# Patient Record
Sex: Female | Born: 1940 | Race: White | Hispanic: No | Marital: Married | Smoking: Former smoker
Health system: Southern US, Community
[De-identification: ages and names within clinical notes are randomized; demographics above are authoritative.]

## PROBLEM LIST (undated history)

## (undated) DIAGNOSIS — K589 Irritable bowel syndrome without diarrhea: Secondary | ICD-10-CM

## (undated) DIAGNOSIS — E11319 Type 2 diabetes mellitus with unspecified diabetic retinopathy without macular edema: Secondary | ICD-10-CM

## (undated) DIAGNOSIS — E0590 Thyrotoxicosis, unspecified without thyrotoxic crisis or storm: Secondary | ICD-10-CM

## (undated) DIAGNOSIS — D649 Anemia, unspecified: Secondary | ICD-10-CM

## (undated) DIAGNOSIS — K2270 Barrett's esophagus without dysplasia: Secondary | ICD-10-CM

## (undated) DIAGNOSIS — E669 Obesity, unspecified: Secondary | ICD-10-CM

## (undated) DIAGNOSIS — K559 Vascular disorder of intestine, unspecified: Secondary | ICD-10-CM

## (undated) DIAGNOSIS — I10 Essential (primary) hypertension: Secondary | ICD-10-CM

## (undated) DIAGNOSIS — I350 Nonrheumatic aortic (valve) stenosis: Secondary | ICD-10-CM

## (undated) DIAGNOSIS — E119 Type 2 diabetes mellitus without complications: Secondary | ICD-10-CM

## (undated) HISTORY — PX: EYE SURGERY: SHX253

## (undated) HISTORY — PX: CHOLECYSTECTOMY: SHX55

## (undated) HISTORY — PX: TONSILLECTOMY: SUR1361

## (undated) HISTORY — PX: CARDIAC CATHETERIZATION: SHX172

---

## 2007-06-04 ENCOUNTER — Ambulatory Visit: Payer: Self-pay | Admitting: Unknown Physician Specialty

## 2007-06-24 ENCOUNTER — Ambulatory Visit: Payer: Self-pay | Admitting: Surgery

## 2007-06-29 ENCOUNTER — Ambulatory Visit: Payer: Self-pay | Admitting: Surgery

## 2007-09-09 ENCOUNTER — Ambulatory Visit: Payer: Self-pay | Admitting: Unknown Physician Specialty

## 2007-11-16 ENCOUNTER — Other Ambulatory Visit: Payer: Self-pay

## 2007-11-16 ENCOUNTER — Inpatient Hospital Stay: Payer: Self-pay | Admitting: Specialist

## 2007-11-26 ENCOUNTER — Ambulatory Visit: Payer: Self-pay | Admitting: Unknown Physician Specialty

## 2008-06-07 ENCOUNTER — Other Ambulatory Visit: Payer: Self-pay

## 2008-06-07 ENCOUNTER — Ambulatory Visit: Payer: Self-pay | Admitting: Ophthalmology

## 2008-06-20 ENCOUNTER — Ambulatory Visit: Payer: Self-pay | Admitting: Ophthalmology

## 2008-09-27 ENCOUNTER — Ambulatory Visit: Payer: Self-pay | Admitting: Unknown Physician Specialty

## 2008-12-02 ENCOUNTER — Ambulatory Visit: Payer: Self-pay | Admitting: Cardiology

## 2009-10-11 ENCOUNTER — Ambulatory Visit: Payer: Self-pay | Admitting: Unknown Physician Specialty

## 2010-02-02 ENCOUNTER — Emergency Department: Payer: Self-pay | Admitting: Emergency Medicine

## 2010-11-16 ENCOUNTER — Ambulatory Visit: Payer: Self-pay | Admitting: Unknown Physician Specialty

## 2011-09-17 ENCOUNTER — Inpatient Hospital Stay: Payer: Self-pay | Admitting: *Deleted

## 2012-01-17 ENCOUNTER — Ambulatory Visit: Payer: Self-pay | Admitting: Unknown Physician Specialty

## 2012-02-28 ENCOUNTER — Ambulatory Visit: Payer: Self-pay | Admitting: Unknown Physician Specialty

## 2012-03-03 LAB — PATHOLOGY REPORT

## 2012-06-11 ENCOUNTER — Ambulatory Visit: Payer: Self-pay | Admitting: Orthopedic Surgery

## 2013-01-27 ENCOUNTER — Ambulatory Visit: Payer: Self-pay | Admitting: Unknown Physician Specialty

## 2015-01-15 NOTE — H&P (Signed)
PATIENT NAME:  Gabrielle Evans, Gabrielle Evans MR#:  [id number] DATE OF BIRTH:  [birth year]  DATE OF ADMISSION:  09/17/2011  REFERRING PHYSICIAN: Dr. Mindi Junker   PRIMARY PHYSICIAN: Silver Huguenin, MD   PRESENTING COMPLAINT: Nausea, vomiting, sore throat, vocal hoarseness.   HISTORY OF PRESENT ILLNESS: Gabrielle Evans is a 74 year old woman with history of hypertension, diabetes, obesity, and history of prior GI bleed who presents today with complaints of worsening symptoms of nausea and vomiting. She reports that approximately six days ago she developed sore throat with progression to vocal hoarseness. She presented to the [facility] on Saturday with management for URI and supportive medication. She developed some nausea later that day with progression to nausea and vomiting the next day on Sunday. She returned to the [facility] and was given antiemetics but still continued to have nausea and vomiting with poor p.o. intake so she presented here for further evaluation. The patient denies any chest pain or shortness of breath. She has minimal cough. No bleeding or hemoptysis. She denies any orthopnea, PND, palpitations, or syncope. She has generalized weakness. No fever that she is aware of.   PAST MEDICAL HISTORY:  1. Hypertension.  2. Diabetes.  3. Obesity.  4. Irritable bowel syndrome.  5. Diabetic retinopathy.  6. History of GI bleed secondary to colitis in 2003. She had a colonoscopy that showed some evidence of colitis.   PAST SURGICAL HISTORY:  1. Bilateral cataract surgery.  2. Cholecystectomy.  3. Right eye surgery.  4. Tonsillectomy.   FAMILY HISTORY: Diabetes, prostate cancer, hypertension, pancreatic cancer, and coronary artery disease. Her mother died of MI at the age of 24.   SOCIAL HISTORY: She lives in [place] with her fiancee. She quit smoking in her 30's. Denies any alcohol or drug use.   ALLERGIES: No known drug allergies. She does report sensitivities to multiple blood pressure  medications.   MEDICATIONS:  1. Hydrochlorothiazide 25 mg daily.  2. Levemir 35 units at bedtime but the patient does adjust her insulin supplementation based on her blood sugar levels.  3. Metformin 1000 mg in the morning and 1500 mg in the evening.  4. Avapro 150 mg. Again, she has been self managing her dosing and has been administering one-third of the 150 which is approximately 50 mg daily.  5. Aspirin 81 mg daily.  6. KCl 10 mEq daily.   REVIEW OF SYSTEMS: CONSTITUTIONAL: No subjective fevers. She has generalized weakness, nausea and vomiting with anorexia. EYES: No glaucoma. She has history of cataracts. ENT: No ear pain or epistaxis. She reports some rhinorrhea and sore throat and vocal hoarseness but denies any dysphagia. RESPIRATORY: Has minimal cough, nonproductive. No wheezing. She denies any hemoptysis. CARDIOVASCULAR: As per history of present illness. GI: Endorses nausea and vomiting. No diarrhea, abdominal pain, hematemesis, or melena. GU: No dysuria or hematuria. ENDOCRINE: No polyuria or polydipsia. HEMATOLOGIC: No easy bleeding. She has easy bruising. SKIN: No ulcers. MUSCULOSKELETAL: No neck pain or joint swelling. No one-sided weakness or numbness. PSYCH: Denies any depression or suicidal ideation.   PHYSICAL EXAMINATION:   VITAL SIGNS: Temperature 99.1, pulse 94, respiratory rate 18, blood pressure 127/59, sating 97% on room air.   GENERAL: Lying in bed in no apparent distress.   HEENT: Normocephalic, atraumatic. Pupils equal and symmetric. Nares without discharge. Oropharynx cobblestone without exudate.   NECK: Soft and supple. No adenopathy or JVP.   CARDIOVASCULAR: Non-tachy. No murmurs, rubs, or gallops.   LUNGS: Basilar crackles to the midlung. No use  of accessory muscles or increased respiratory effort.   ABDOMEN: Soft, nontender. Positive bowel sounds.   EXTREMITIES: Trace edema. Dorsal pedis pulses intact.   MUSCULOSKELETAL: No joint effusion.   SKIN: No  ulcers.   NEUROLOGIC: Symmetrical strength. No focal deficits.   PSYCH: She is alert and oriented. The patient is cooperative.   PERTINENT LABS AND STUDIES: CT scan of the abdomen and pelvis shows abnormal appearance of the lower lobe of the left lung consistent with pneumonia. This will merit follow-up to assure complete clearing following anticipated antibiotic therapy. No evidence of acute inflammatory process within the abdomen and pelvis. No obstruction of either kidney. There are cortical cysts bilaterally.  WBC 19.5, hemoglobin 11, hematocrit 32.8, platelets 236, MCV 90, glucose 151, BUN 20, creatinine 0.85, sodium 135, potassium 3.3, chloride 95, carbon dioxide 32, calcium 8.9. LFTs within normal limits. Influenza negative. Urinalysis with specific gravity of 1.024, pH 7, protein 100 mg/dL, leukocyte esterase trace, RBC 3 per high-power field, WBC 3 per high-power field. Lipase 43.   ASSESSMENT AND PLAN: Gabrielle Evans is a 74 year old woman with history of diabetes, hypertension, GI bleed and colitis presenting with sore throat, vocal hoarseness, nausea and vomiting.  1. Left lower lobe pneumonia. Continue Levaquin, oxygen, and SVNs as needed. Send blood cultures and sputum culture if possible.  2. Hypertension. Restart her Avapro at what she is usually taking. Restart hydrochlorothiazide and aspirin.  3. Diabetes. Sliding scale insulin and Levemir.  4. Hypokalemia. Restart her potassium supplementation at an increased dose.  5. Prophylaxis with Lovenox, aspirin, and Protonix.   TIME SPENT: Approximately 45 minutes spent on patient care.   ____________________________ Reuel DerbyAlounthith Annissa Andreoni, MD ap:drc D: 09/17/2011 23:14:24 ET T: 09/18/2011 08:47:41 ET JOB#: [id number]  cc: Pearlean BrownieAlounthith Elvert Cumpton, MD, <Dictator> Yetta FlockAileen H. Miller, MD Reuel DerbyALOUNTHITH Daisy Mcneel MD ELECTRONICALLY SIGNED 10/12/2011 2:20

## 2015-01-15 NOTE — Discharge Summary (Signed)
PATIENT NAME:  Gabrielle Evans, [name] MR#:  [id number] DATE OF BIRTH:  09-Nov-1940  DATE OF ADMISSION:  09/17/2011 DATE OF DISCHARGE:  09/20/2011  DISCHARGE DIAGNOSES:  1. Left lower lobe pneumonia.  2. Diabetes.  3. Hypertension.  4. Hypokalemia. 5. Irritable bowel syndrome.   HOSPITAL COURSE: A 74 year old female who has history of diabetes, hypertension. She presented with some nausea, vomiting, sore throat and vocal hoarseness. She initially presented to walk-in clinic prior to this admission and she was being managed as upper respiratory infection. Then she developed some nausea. When she came in she was found to have a white count of 19,000. She had a CT abdomen and pelvis done with contrast because of her nausea and vomiting and it showed abnormal appearance of the lower lobe of the left lung consistent with pneumonia. No acute abnormality in the abdomen and pelvis. No evidence of obstruction. Cortical cysts bilaterally. No bowel abnormality. She had a chest x-ray also done which showed increased density in the lower lung field especially in the left consistent with developing pneumonia. No acute intra-abdominal abnormality. She was started on Levaquin. She was also given some DuoNebs as needed. Her urinalysis was negative for any pyuria but shows 2+ ketones. She was given IV hydration. Her blood cultures remained negative. Influenza A and B antigens were negative. Her white count has significantly improved to 12.2 at the time of discharge. She is feeling much better. She is saturating 95% on room air. She is hemodynamically stable. She got thee days of Levaquin in the hospital so I will give her seven days of Levaquin as outpatient. When she came in she was also hypokalemic with a potassium of 3.3, BUN was 20. She was given some IV hydration. Her potassium and magnesium have been continuously replaced. Her magnesium on 12/27 is normal at 2, potassium 3.4 today. I have given her 20 mEq of potassium before  discharge and she takes about 10 mEq of potassium at home. I will also decrease her hydrochlorothiazide to 12.5 daily because of her persistent hypokalemia. She also developed some diarrhea during the hospital stay but that has resolved now. She should have a follow-up CBC, BMP at Dr. Rondel BatonMiller's office.   MEDICATIONS AT DISCHARGE/HOME MEDICATIONS:  1. Levemir insulin. 2. Metformin 1500 mg in evening and 1000 mg in the morning. 3. Avapro once daily. 4. Aspirin 81 mg daily. 5. Flonase nasal spray.  6. Benzonatate.  7. Zofran ODT 3 times a day as needed. 8. Change hydrochlorothiazide to 12.5 mg (half tablet of 25 mg) p.o. once daily. 9. Continue potassium 10 mEq daily which is home medication.   NEW MEDICATIONS:  1. Levaquin 500 mg p.o. once daily for seven days. 2. Robitussin-DM 5 mL p.o. q.6 hours as needed for cough.  PHYSICAL EXAMINATION: VITAL SIGNS: T-max 98.3, heart rate 75, blood pressure 126/83, saturating 95% on room air. Chest has some crackles in the left side base, otherwise clear. Normal respiratory effort. Abdomen soft, nontender.   DIET: Advised a low-sodium, ADA diet.   FOLLOW UP: Follow up with Dr. Silver HugueninAileen Evans next week. Follow-up CBC, BMP at Dr. Rondel BatonMiller's office.   TIME SPENT WITH DISCHARGE: 40 minutes.   ____________________________ Gabrielle SorrowAbhinav Jessi Pitstick, MD ag:cms D: 09/20/2011 12:33:08 ET T: 09/20/2011 12:44:26 ET JOB#: [id number]  cc: Gabrielle SorrowAbhinav Nana Vastine, MD, <Dictator> Gabrielle FlockAileen H. Miller, MD  Gabrielle SorrowABHINAV Theodoro Koval MD ELECTRONICALLY SIGNED 10/11/2011 12:19

## 2015-04-18 ENCOUNTER — Other Ambulatory Visit: Payer: Self-pay | Admitting: Internal Medicine

## 2015-04-18 DIAGNOSIS — Z1231 Encounter for screening mammogram for malignant neoplasm of breast: Secondary | ICD-10-CM

## 2015-04-21 ENCOUNTER — Other Ambulatory Visit: Payer: Self-pay | Admitting: Internal Medicine

## 2015-04-21 ENCOUNTER — Encounter (INDEPENDENT_AMBULATORY_CARE_PROVIDER_SITE_OTHER): Payer: Self-pay

## 2015-04-21 ENCOUNTER — Ambulatory Visit
Admission: RE | Admit: 2015-04-21 | Discharge: 2015-04-21 | Disposition: A | Discharge status: Home / Self Care | Payer: Medicare Other | Source: Ambulatory Visit | Attending: Internal Medicine | Admitting: Internal Medicine

## 2015-04-21 DIAGNOSIS — Z1231 Encounter for screening mammogram for malignant neoplasm of breast: Secondary | ICD-10-CM | POA: Diagnosis not present

## 2015-06-29 ENCOUNTER — Encounter: Payer: Self-pay | Admitting: *Deleted

## 2015-06-30 ENCOUNTER — Encounter: Payer: Self-pay | Admitting: *Deleted

## 2015-06-30 ENCOUNTER — Ambulatory Visit: Payer: Medicare Other | Admitting: Registered Nurse

## 2015-06-30 ENCOUNTER — Ambulatory Visit
Admission: RE | Admit: 2015-06-30 | Discharge: 2015-06-30 | Disposition: A | Discharge status: Home / Self Care | Payer: Medicare Other | Source: Ambulatory Visit | Attending: Unknown Physician Specialty | Admitting: Unknown Physician Specialty

## 2015-06-30 ENCOUNTER — Encounter
Admission: RE | Disposition: A | Discharge status: Home / Self Care | Payer: Self-pay | Source: Ambulatory Visit | Attending: Unknown Physician Specialty

## 2015-06-30 DIAGNOSIS — K449 Diaphragmatic hernia without obstruction or gangrene: Secondary | ICD-10-CM | POA: Insufficient documentation

## 2015-06-30 DIAGNOSIS — E11319 Type 2 diabetes mellitus with unspecified diabetic retinopathy without macular edema: Secondary | ICD-10-CM | POA: Diagnosis not present

## 2015-06-30 DIAGNOSIS — R002 Palpitations: Secondary | ICD-10-CM | POA: Diagnosis not present

## 2015-06-30 DIAGNOSIS — E669 Obesity, unspecified: Secondary | ICD-10-CM | POA: Diagnosis not present

## 2015-06-30 DIAGNOSIS — Z833 Family history of diabetes mellitus: Secondary | ICD-10-CM | POA: Insufficient documentation

## 2015-06-30 DIAGNOSIS — Z8042 Family history of malignant neoplasm of prostate: Secondary | ICD-10-CM | POA: Insufficient documentation

## 2015-06-30 DIAGNOSIS — Z6841 Body Mass Index (BMI) 40.0 and over, adult: Secondary | ICD-10-CM | POA: Diagnosis not present

## 2015-06-30 DIAGNOSIS — Z888 Allergy status to other drugs, medicaments and biological substances status: Secondary | ICD-10-CM | POA: Diagnosis not present

## 2015-06-30 DIAGNOSIS — I8393 Asymptomatic varicose veins of bilateral lower extremities: Secondary | ICD-10-CM | POA: Diagnosis not present

## 2015-06-30 DIAGNOSIS — Z87891 Personal history of nicotine dependence: Secondary | ICD-10-CM | POA: Insufficient documentation

## 2015-06-30 DIAGNOSIS — Z8249 Family history of ischemic heart disease and other diseases of the circulatory system: Secondary | ICD-10-CM | POA: Insufficient documentation

## 2015-06-30 DIAGNOSIS — K2900 Acute gastritis without bleeding: Secondary | ICD-10-CM | POA: Diagnosis not present

## 2015-06-30 DIAGNOSIS — G8929 Other chronic pain: Secondary | ICD-10-CM | POA: Diagnosis not present

## 2015-06-30 DIAGNOSIS — Z9049 Acquired absence of other specified parts of digestive tract: Secondary | ICD-10-CM | POA: Insufficient documentation

## 2015-06-30 DIAGNOSIS — I2510 Atherosclerotic heart disease of native coronary artery without angina pectoris: Secondary | ICD-10-CM | POA: Insufficient documentation

## 2015-06-30 DIAGNOSIS — I350 Nonrheumatic aortic (valve) stenosis: Secondary | ICD-10-CM | POA: Diagnosis not present

## 2015-06-30 DIAGNOSIS — Z7984 Long term (current) use of oral hypoglycemic drugs: Secondary | ICD-10-CM | POA: Insufficient documentation

## 2015-06-30 DIAGNOSIS — K589 Irritable bowel syndrome without diarrhea: Secondary | ICD-10-CM | POA: Insufficient documentation

## 2015-06-30 DIAGNOSIS — K2950 Unspecified chronic gastritis without bleeding: Secondary | ICD-10-CM | POA: Insufficient documentation

## 2015-06-30 DIAGNOSIS — I10 Essential (primary) hypertension: Secondary | ICD-10-CM | POA: Insufficient documentation

## 2015-06-30 DIAGNOSIS — Z79899 Other long term (current) drug therapy: Secondary | ICD-10-CM | POA: Diagnosis not present

## 2015-06-30 DIAGNOSIS — D509 Iron deficiency anemia, unspecified: Secondary | ICD-10-CM | POA: Insufficient documentation

## 2015-06-30 DIAGNOSIS — Z7982 Long term (current) use of aspirin: Secondary | ICD-10-CM | POA: Insufficient documentation

## 2015-06-30 DIAGNOSIS — Z955 Presence of coronary angioplasty implant and graft: Secondary | ICD-10-CM | POA: Insufficient documentation

## 2015-06-30 DIAGNOSIS — K2270 Barrett's esophagus without dysplasia: Secondary | ICD-10-CM | POA: Diagnosis not present

## 2015-06-30 DIAGNOSIS — F419 Anxiety disorder, unspecified: Secondary | ICD-10-CM | POA: Insufficient documentation

## 2015-06-30 DIAGNOSIS — R0600 Dyspnea, unspecified: Secondary | ICD-10-CM | POA: Insufficient documentation

## 2015-06-30 DIAGNOSIS — Z9849 Cataract extraction status, unspecified eye: Secondary | ICD-10-CM | POA: Insufficient documentation

## 2015-06-30 DIAGNOSIS — K648 Other hemorrhoids: Secondary | ICD-10-CM | POA: Insufficient documentation

## 2015-06-30 DIAGNOSIS — Z800 Family history of malignant neoplasm of digestive organs: Secondary | ICD-10-CM | POA: Insufficient documentation

## 2015-06-30 DIAGNOSIS — R1084 Generalized abdominal pain: Secondary | ICD-10-CM | POA: Insufficient documentation

## 2015-06-30 HISTORY — DX: Thyrotoxicosis, unspecified without thyrotoxic crisis or storm: E05.90

## 2015-06-30 HISTORY — DX: Essential (primary) hypertension: I10

## 2015-06-30 HISTORY — DX: Anemia, unspecified: D64.9

## 2015-06-30 HISTORY — DX: Barrett's esophagus without dysplasia: K22.70

## 2015-06-30 HISTORY — DX: Type 2 diabetes mellitus without complications: E11.9

## 2015-06-30 HISTORY — DX: Vascular disorder of intestine, unspecified: K55.9

## 2015-06-30 HISTORY — DX: Obesity, unspecified: E66.9

## 2015-06-30 HISTORY — DX: Type 2 diabetes mellitus with unspecified diabetic retinopathy without macular edema: E11.319

## 2015-06-30 HISTORY — PX: ESOPHAGOGASTRODUODENOSCOPY (EGD) WITH PROPOFOL: SHX5813

## 2015-06-30 HISTORY — PX: COLONOSCOPY WITH PROPOFOL: SHX5780

## 2015-06-30 HISTORY — DX: Nonrheumatic aortic (valve) stenosis: I35.0

## 2015-06-30 HISTORY — DX: Irritable bowel syndrome, unspecified: K58.9

## 2015-06-30 LAB — GLUCOSE, CAPILLARY: Glucose-Capillary: 125 mg/dL — ABNORMAL HIGH (ref 65–99)

## 2015-06-30 SURGERY — COLONOSCOPY WITH PROPOFOL
Anesthesia: General

## 2015-06-30 MED ORDER — GLYCOPYRROLATE 0.2 MG/ML IJ SOLN
INTRAMUSCULAR | Status: DC | PRN
  Administered 2015-06-30: 0.2 mg via INTRAVENOUS

## 2015-06-30 MED ORDER — SODIUM CHLORIDE 0.9 % IV SOLN
INTRAVENOUS | Status: DC
  Administered 2015-06-30: 08:00:00 via INTRAVENOUS

## 2015-06-30 MED ORDER — SODIUM CHLORIDE 0.9 % IV SOLN: INTRAVENOUS | Status: DC

## 2015-06-30 MED ORDER — PROPOFOL 500 MG/50ML IV EMUL
INTRAVENOUS | Status: DC | PRN
  Administered 2015-06-30: 140 ug/kg/min via INTRAVENOUS

## 2015-06-30 MED ORDER — MIDAZOLAM HCL 2 MG/2ML IJ SOLN
INTRAMUSCULAR | Status: DC | PRN
  Administered 2015-06-30: 1 mg via INTRAVENOUS

## 2015-06-30 MED ORDER — LIDOCAINE HCL (CARDIAC) 20 MG/ML IV SOLN
INTRAVENOUS | Status: DC | PRN
Start: 2015-06-30 — End: 2015-06-30
  Administered 2015-06-30: 30 mg via INTRAVENOUS

## 2015-06-30 MED ORDER — ALFENTANIL 500 MCG/ML IJ INJ
INJECTION | INTRAMUSCULAR | Status: DC | PRN
  Administered 2015-06-30: 250 ug via INTRAVENOUS

## 2015-06-30 MED ORDER — PROPOFOL 10 MG/ML IV BOLUS
INTRAVENOUS | Status: DC | PRN
  Administered 2015-06-30: 40 mg via INTRAVENOUS

## 2015-06-30 NOTE — H&P (Signed)
Primary Care Physician:  Leotis Shames, MD Primary Gastroenterologist:  Dr. Mechele Collin  Pre-Procedure History & Physical: HPI:  Gabrielle Evans is a 74 y.o. female is here for an endoscopy and colonoscopy.   Past Medical History  Diagnosis Date  . Anemia   . Barrett's esophagus   . Diabetes mellitus without complication (HCC)   . Hypertension   . Irritable bowel syndrome   . Ischemic colitis (HCC)   . Aortic stenosis, mild   . Diabetic retinopathy (HCC)   . Obesity   . Hyperthyroidism     Past Surgical History  Procedure Laterality Date  . Eye surgery    . Cholecystectomy    . Tonsillectomy    . Cardiac catheterization      Prior to Admission medications   Medication Sig Start Date End Date Taking? Authorizing Provider  ALPRAZolam Prudy Feeler) 0.25 MG tablet Take 0.25 mg by mouth at bedtime as needed for anxiety.   Yes Historical Provider, MD  aspirin EC 81 MG tablet Take 81 mg by mouth daily.   Yes Historical Provider, MD  Calcium-Vitamin D-Vitamin K 500-100-40 MG-UNT-MCG CHEW Chew 1 tablet by mouth daily.   Yes Historical Provider, MD  dicyclomine (BENTYL) 20 MG tablet Take 20 mg by mouth every 6 (six) hours as needed for spasms.   Yes Historical Provider, MD  DOCUSATE CALCIUM PO Take 3 tablets by mouth at bedtime as needed.   Yes Historical Provider, MD  ferrous sulfate 325 (65 FE) MG tablet Take 325 mg by mouth daily with breakfast.   Yes Historical Provider, MD  furosemide (LASIX) 20 MG tablet Take 20 mg by mouth daily. As needed for edema   Yes Historical Provider, MD  hydrochlorothiazide (HYDRODIURIL) 25 MG tablet Take 25 mg by mouth daily.   Yes Historical Provider, MD  irbesartan (AVAPRO) 150 MG tablet Take 150 mg by mouth daily.   Yes Historical Provider, MD  latanoprost (XALATAN) 0.005 % ophthalmic solution Place 1 drop into both eyes at bedtime.   Yes Historical Provider, MD  magnesium oxide (MAG-OX) 400 MG tablet Take 400 mg by mouth daily.   Yes Historical Provider,  MD  metFORMIN (GLUCOPHAGE) 1000 MG tablet Take 1,000 mg by mouth 2 (two) times daily with a meal. Take one tablet by mouth every morning and 1 and 1/2 tablet every evening.   Yes Historical Provider, MD  Multiple Vitamin (MULTIVITAMIN) tablet Take 1 tablet by mouth daily.   Yes Historical Provider, MD  OMEGA-3 FATTY ACIDS PO Take 1 capsule by mouth daily.   Yes Historical Provider, MD  omeprazole (PRILOSEC) 20 MG capsule Take 20 mg by mouth daily.   Yes Historical Provider, MD  potassium chloride (K-DUR,KLOR-CON) 10 MEQ tablet Take 10 mEq by mouth daily.   Yes Historical Provider, MD  pregabalin (LYRICA) 75 MG capsule Take 75 mg by mouth 2 (two) times daily.   Yes Historical Provider, MD  sodium chloride (MURO 128) 5 % ophthalmic solution Place 1 drop into both eyes as needed for irritation.   Yes Historical Provider, MD  sucralfate (CARAFATE) 1 G tablet Take 1 g by mouth 4 (four) times daily -  with meals and at bedtime.   Yes Historical Provider, MD    Allergies as of 06/15/2015  . (Not on File)    History reviewed. No pertinent family history.  Social History   Social History  . Marital Status: Married    Spouse Name: N/A  . Number of Children: N/A  .  Years of Education: N/A   Occupational History  . Not on file.   Social History Main Topics  . Smoking status: Former Games developer  . Smokeless tobacco: Never Used  . Alcohol Use: 2.4 oz/week    4 Glasses of wine per week  . Drug Use: No  . Sexual Activity: Not on file   Other Topics Concern  . Not on file   Social History Narrative    Review of Systems: See HPI, otherwise negative ROS  Physical Exam: BP 214/66 mmHg  Pulse 83  Temp(Src) 97.5 F (36.4 C) (Tympanic)  Resp 14  Ht 5' (1.524 m)  Wt 94.348 kg (208 lb)  BMI 40.62 kg/m2  SpO2 100% General:   Alert,  pleasant and cooperative in NAD Head:  Normocephalic and atraumatic. Neck:  Supple; no masses or thyromegaly. Lungs:  Clear throughout to auscultation.     Heart:  Regular rate and rhythm. Abdomen:  Soft, nontender and nondistended. Normal bowel sounds, without guarding, and without rebound.   Neurologic:  Alert and  oriented x4;  grossly normal neurologically.  Impression/Plan: Gabrielle Evans is here for an endoscopy and colonoscopy to be performed for Iron def anemia, Barretts esophagus, chronic abdominal pain  Risks, benefits, limitations, and alternatives regarding  endoscopy and colonoscopy have been reviewed with the patient.  Questions have been answered.  All parties agreeable.   Lynnae Prude, MD  06/30/2015, 8:03 AM    Primary Care Physician:  Leotis Shames, MD Primary Gastroenterologist:  Dr. Mechele Collin  Pre-Procedure History & Physical: HPI:  Gabrielle Evans is a 74 y.o. female is here for an endoscopy and colonoscopy.   Past Medical History  Diagnosis Date  . Anemia   . Barrett's esophagus   . Diabetes mellitus without complication (HCC)   . Hypertension   . Irritable bowel syndrome   . Ischemic colitis (HCC)   . Aortic stenosis, mild   . Diabetic retinopathy (HCC)   . Obesity   . Hyperthyroidism     Past Surgical History  Procedure Laterality Date  . Eye surgery    . Cholecystectomy    . Tonsillectomy    . Cardiac catheterization      Prior to Admission medications   Medication Sig Start Date End Date Taking? Authorizing Provider  ALPRAZolam Prudy Feeler) 0.25 MG tablet Take 0.25 mg by mouth at bedtime as needed for anxiety.   Yes Historical Provider, MD  aspirin EC 81 MG tablet Take 81 mg by mouth daily.   Yes Historical Provider, MD  Calcium-Vitamin D-Vitamin K 500-100-40 MG-UNT-MCG CHEW Chew 1 tablet by mouth daily.   Yes Historical Provider, MD  dicyclomine (BENTYL) 20 MG tablet Take 20 mg by mouth every 6 (six) hours as needed for spasms.   Yes Historical Provider, MD  DOCUSATE CALCIUM PO Take 3 tablets by mouth at bedtime as needed.   Yes Historical Provider, MD  ferrous sulfate 325 (65 FE) MG tablet Take  325 mg by mouth daily with breakfast.   Yes Historical Provider, MD  furosemide (LASIX) 20 MG tablet Take 20 mg by mouth daily. As needed for edema   Yes Historical Provider, MD  hydrochlorothiazide (HYDRODIURIL) 25 MG tablet Take 25 mg by mouth daily.   Yes Historical Provider, MD  irbesartan (AVAPRO) 150 MG tablet Take 150 mg by mouth daily.   Yes Historical Provider, MD  latanoprost (XALATAN) 0.005 % ophthalmic solution Place 1 drop into both eyes at bedtime.   Yes Historical Provider, MD  magnesium  oxide (MAG-OX) 400 MG tablet Take 400 mg by mouth daily.   Yes Historical Provider, MD  metFORMIN (GLUCOPHAGE) 1000 MG tablet Take 1,000 mg by mouth 2 (two) times daily with a meal. Take one tablet by mouth every morning and 1 and 1/2 tablet every evening.   Yes Historical Provider, MD  Multiple Vitamin (MULTIVITAMIN) tablet Take 1 tablet by mouth daily.   Yes Historical Provider, MD  OMEGA-3 FATTY ACIDS PO Take 1 capsule by mouth daily.   Yes Historical Provider, MD  omeprazole (PRILOSEC) 20 MG capsule Take 20 mg by mouth daily.   Yes Historical Provider, MD  potassium chloride (K-DUR,KLOR-CON) 10 MEQ tablet Take 10 mEq by mouth daily.   Yes Historical Provider, MD  pregabalin (LYRICA) 75 MG capsule Take 75 mg by mouth 2 (two) times daily.   Yes Historical Provider, MD  sodium chloride (MURO 128) 5 % ophthalmic solution Place 1 drop into both eyes as needed for irritation.   Yes Historical Provider, MD  sucralfate (CARAFATE) 1 G tablet Take 1 g by mouth 4 (four) times daily -  with meals and at bedtime.   Yes Historical Provider, MD    Allergies as of 06/15/2015  . (Not on File)    History reviewed. No pertinent family history.  Social History   Social History  . Marital Status: Married    Spouse Name: N/A  . Number of Children: N/A  . Years of Education: N/A   Occupational History  . Not on file.   Social History Main Topics  . Smoking status: Former Games developer  . Smokeless tobacco:  Never Used  . Alcohol Use: 2.4 oz/week    4 Glasses of wine per week  . Drug Use: No  . Sexual Activity: Not on file   Other Topics Concern  . Not on file   Social History Narrative    Review of Systems: See HPI, otherwise negative ROS  Physical Exam: BP 214/66 mmHg  Pulse 83  Temp(Src) 97.5 F (36.4 C) (Tympanic)  Resp 14  Ht 5' (1.524 m)  Wt 94.348 kg (208 lb)  BMI 40.62 kg/m2  SpO2 100% General:   Alert,  pleasant and cooperative in NAD Head:  Normocephalic and atraumatic. Neck:  Supple; no masses or thyromegaly. Lungs:  Clear throughout to auscultation.    Heart:  Regular rate and rhythm. Abdomen:  Soft, nontender and nondistended. Normal bowel sounds, without guarding, and without rebound.   Neurologic:  Alert and  oriented x4;  grossly normal neurologically.  Impression/Plan: Gabrielle Evans is here for an endoscopy and colonoscopy to be performed for iron deficency anemia, chronic abd pain and Barretts esophagus  Risks, benefits, limitations, and alternatives regarding  endoscopy and colonoscopy have been reviewed with the patient.  Questions have been answered.  All parties agreeable.   Lynnae Prude, MD  06/30/2015, 8:03 AM

## 2015-06-30 NOTE — Op Note (Signed)
[facility] Gastroenterology Patient Name: Gabrielle Evans Procedure Date: 06/30/2015 8:03 AM MRN: [id number] Account #: [id number] Date of Birth: [birth year] Admit Type: Outpatient Age: 74 Room: [facility] ENDO ROOM 1 Gender: Female Note Status: Finalized Procedure:         Upper GI endoscopy Indications:       Follow-up of Barrett's esophagus Providers:         Scot Jun, MD Referring MD:      Leotis Shames (Referring MD) Medicines:         Propofol per Anesthesia Complications:     No immediate complications. Procedure:         Pre-Anesthesia Assessment:                    - After reviewing the risks and benefits, the patient was                     deemed in satisfactory condition to undergo the procedure.                    After obtaining informed consent, the endoscope was passed                     under direct vision. Throughout the procedure, the                     patient's blood pressure, pulse, and oxygen saturations                     were monitored continuously. The Endoscope was introduced                     through the mouth, and advanced to the second part of                     duodenum. The upper GI endoscopy was accomplished without                     difficulty. The patient tolerated the procedure well. Findings:      There were esophageal mucosal changes secondary to established       short-segment Barrett's disease present at the gastroesophageal       junction. The maximum longitudinal extent of these mucosal changes was 1       cm in length. Mucosa was biopsied with a cold forceps for histology. One       specimen bottle was sent to pathology. GGEJ 35cm.      A small hiatus hernia was present.      Localized and patchy moderate inflammation characterized by congestion       (edema), erosions and erythema was found in the gastric antrum. Biopsies       were taken with a cold forceps for histology. Biopsies were taken with a       cold  forceps for Helicobacter pylori testing. Some focal edema with       centrl erosions seen.      The examined duodenum was normal. Impression:        - Esophageal mucosal changes secondary to established                     short-segment Barrett's disease. Biopsied.                    - Small hiatus  hernia.                    - Gastritis. Biopsied.                    - Normal examined duodenum. Recommendation:    - Await pathology results.                    - Perform a colonoscopy as previously scheduled. Scot Jun, MD 06/30/2015 8:22:00 AM This report has been signed electronically. Number of Addenda: 0 Note Initiated On: 06/30/2015 8:03 AM      [facility]

## 2015-06-30 NOTE — Transfer of Care (Signed)
Immediate Anesthesia Transfer of Care Note  Patient: Gabrielle Evans  Procedure(s) Performed: Procedure(s): COLONOSCOPY WITH PROPOFOL (N/A) ESOPHAGOGASTRODUODENOSCOPY (EGD) WITH PROPOFOL (N/A)  Patient Location: PACU and Endoscopy Unit  Anesthesia Type:General  Level of Consciousness: sedated  Airway & Oxygen Therapy: Patient Spontanous Breathing and Patient connected to nasal cannula oxygen  Post-op Assessment: Report given to RN and Post -op Vital signs reviewed and stable  Post vital signs: Reviewed and stable  Last Vitals:  Filed Vitals:   06/30/15 0845  BP:   Pulse:   Temp: 36.2 C  Resp:     Complications: No apparent anesthesia complications

## 2015-06-30 NOTE — Op Note (Signed)
[facility] Gastroenterology Patient Name: Gabrielle Evans Procedure Date: 06/30/2015 8:04 AM MRN: [id number] Account #: [id number] Date of Birth: 05/07/1941 Admit Type: Outpatient Age: 74 Room: [facility] ENDO ROOM 1 Gender: Female Note Status: Finalized Procedure:         Colonoscopy Indications:       Generalized abdominal pain, Iron deficiency anemia Providers:         Scot Jun, MD Referring MD:      Leotis Shames (Referring MD) Medicines:         Propofol per Anesthesia Complications:     No immediate complications. Procedure:         Pre-Anesthesia Assessment:                    - After reviewing the risks and benefits, the patient was                     deemed in satisfactory condition to undergo the procedure.                    After obtaining informed consent, the colonoscope was                     passed under direct vision. Throughout the procedure, the                     patient's blood pressure, pulse, and oxygen saturations                     were monitored continuously. The Colonoscope was                     introduced through the anus and advanced to the the cecum,                     identified by appendiceal orifice and ileocecal valve. The                     colonoscopy was performed without difficulty. The patient                     tolerated the procedure well. The quality of the bowel                     preparation was good. Findings:      Internal hemorrhoids were found during endoscopy. The hemorrhoids were       small.      The exam was otherwise without abnormality. Impression:        - Internal hemorrhoids.                    - The examination was otherwise normal.                    - No specimens collected. Recommendation:    - The findings and recommendations were discussed with the                     patient's family. Suggest stool softeners with senna                     laxative if needed. [name] has IBS (irritable  bowel                     syndrome). [name]  [name], MD 06/30/2015 8:42:07 AM This report has been signed electronically. Number of Addenda: 0 Note Initiated On: 06/30/2015 8:04 AM Scope Withdrawal Time: 0 hours 7 minutes 35 seconds  Total Procedure Duration: 0 hours 15 minutes 36 seconds       [facility]

## 2015-06-30 NOTE — Anesthesia Procedure Notes (Signed)
Date/Time: 06/30/2015 8:07 AM Performed by: Stormy Fabian Pre-anesthesia Checklist: Patient identified, Emergency Drugs available, Suction available and Patient being monitored Patient Re-evaluated:Patient Re-evaluated prior to inductionOxygen Delivery Method: Nasal cannula Intubation Type: IV induction Dental Injury: Teeth and Oropharynx as per pre-operative assessment  Comments: Nasal cannula with etCO2 monitoring

## 2015-06-30 NOTE — Anesthesia Postprocedure Evaluation (Signed)
  Anesthesia Post-op Note  Patient: Gabrielle Evans  Procedure(s) Performed: Procedure(s): COLONOSCOPY WITH PROPOFOL (N/A) ESOPHAGOGASTRODUODENOSCOPY (EGD) WITH PROPOFOL (N/A)  Anesthesia type:General  Patient location: PACU  Post pain: Pain level controlled  Post assessment: Post-op Vital signs reviewed, Patient's Cardiovascular Status Stable, Respiratory Function Stable, Patent Airway and No signs of Nausea or vomiting  Post vital signs: Reviewed and stable  Last Vitals:  Filed Vitals:   06/30/15 0845  BP:   Pulse:   Temp: 36.2 C  Resp:     Level of consciousness: awake, alert  and patient cooperative  Complications: No apparent anesthesia complications

## 2015-06-30 NOTE — Anesthesia Preprocedure Evaluation (Signed)
Anesthesia Evaluation  Patient identified by MRN, date of birth, ID band Patient awake    Reviewed: Allergy & Precautions, H&P , NPO status , Patient's Chart, lab work & pertinent test results  History of Anesthesia Complications Negative for: history of anesthetic complications  Airway Mallampati: III  TM Distance: >3 FB Neck ROM: limited    Dental  (+) Poor Dentition, Missing, Edentulous Upper, Edentulous Lower   Pulmonary neg shortness of breath, former smoker,    Pulmonary exam normal breath sounds clear to auscultation       Cardiovascular Exercise Tolerance: Good hypertension, (-) Past MI Normal cardiovascular exam Rhythm:regular Rate:Normal     Neuro/Psych negative neurological ROS  negative psych ROS   GI/Hepatic negative GI ROS, Neg liver ROS,   Endo/Other  diabetes, Type 2, Insulin DependentHyperthyroidism   Renal/GU negative Renal ROS  negative genitourinary   Musculoskeletal   Abdominal   Peds  Hematology negative hematology ROS (+)   Anesthesia Other Findings Past Medical History:   Anemia                                                       Barrett's esophagus                                          Diabetes mellitus without complication (HCC)                 Hypertension                                                 Irritable bowel syndrome                                     Ischemic colitis (HCC)                                       Aortic stenosis, mild                                        Diabetic retinopathy (HCC)                                   Obesity                                                      Hyperthyroidism  Reproductive/Obstetrics negative OB ROS                             Anesthesia Physical Anesthesia Plan  ASA: III  Anesthesia Plan: General   Post-op Pain Management:    Induction:    Airway Management Planned:   Additional Equipment:   Intra-op Plan:   Post-operative Plan:   Informed Consent: I have reviewed the patients History and Physical, chart, labs and discussed the procedure including the risks, benefits and alternatives for the proposed anesthesia with the patient or authorized representative who has indicated his/her understanding and acceptance.   Dental Advisory Given  Plan Discussed with: Anesthesiologist, CRNA and Surgeon  Anesthesia Plan Comments:         Anesthesia Quick Evaluation

## 2015-07-03 ENCOUNTER — Encounter: Payer: Self-pay | Admitting: Unknown Physician Specialty

## 2015-07-03 LAB — SURGICAL PATHOLOGY

## 2016-03-28 ENCOUNTER — Other Ambulatory Visit: Payer: Self-pay | Admitting: Internal Medicine

## 2016-03-28 DIAGNOSIS — Z1231 Encounter for screening mammogram for malignant neoplasm of breast: Secondary | ICD-10-CM

## 2016-04-23 ENCOUNTER — Ambulatory Visit: Payer: Medicare Other

## 2016-05-08 ENCOUNTER — Ambulatory Visit
Admission: RE | Admit: 2016-05-08 | Discharge: 2016-05-08 | Disposition: A | Discharge status: Home / Self Care | Payer: Medicare Other | Source: Ambulatory Visit | Attending: Internal Medicine | Admitting: Internal Medicine

## 2016-05-08 ENCOUNTER — Other Ambulatory Visit: Payer: Self-pay | Admitting: Internal Medicine

## 2016-05-08 DIAGNOSIS — Z1231 Encounter for screening mammogram for malignant neoplasm of breast: Secondary | ICD-10-CM

## 2016-08-06 ENCOUNTER — Other Ambulatory Visit: Payer: Self-pay | Admitting: Otolaryngology

## 2016-08-06 DIAGNOSIS — R42 Dizziness and giddiness: Secondary | ICD-10-CM

## 2016-08-14 ENCOUNTER — Ambulatory Visit
Admission: RE | Admit: 2016-08-14 | Discharge: 2016-08-14 | Disposition: A | Discharge status: Home / Self Care | Payer: Medicare Other | Source: Ambulatory Visit | Attending: Otolaryngology | Admitting: Otolaryngology

## 2016-08-21 ENCOUNTER — Ambulatory Visit
Admission: RE | Admit: 2016-08-21 | Discharge: 2016-08-21 | Disposition: A | Discharge status: Home / Self Care | Payer: Medicare Other | Source: Ambulatory Visit | Attending: Otolaryngology | Admitting: Otolaryngology

## 2016-08-21 DIAGNOSIS — R42 Dizziness and giddiness: Secondary | ICD-10-CM | POA: Diagnosis not present

## 2016-08-21 LAB — POCT I-STAT CREATININE: Creatinine, Ser: 0.9 mg/dL (ref 0.44–1.00)

## 2016-08-21 MED ORDER — IOPAMIDOL (ISOVUE-300) INJECTION 61%
75.0000 mL | Freq: Once | INTRAVENOUS | Status: AC | PRN
  Administered 2016-08-21: 75 mL via INTRAVENOUS

## 2017-08-04 ENCOUNTER — Other Ambulatory Visit: Payer: Self-pay | Admitting: Internal Medicine

## 2017-08-04 DIAGNOSIS — N183 Chronic kidney disease, stage 3 unspecified: Secondary | ICD-10-CM

## 2017-08-11 ENCOUNTER — Ambulatory Visit
Admission: RE | Admit: 2017-08-11 | Discharge: 2017-08-11 | Disposition: A | Discharge status: Home / Self Care | Payer: Medicare Other | Source: Ambulatory Visit | Attending: Internal Medicine | Admitting: Internal Medicine

## 2017-08-11 DIAGNOSIS — I119 Hypertensive heart disease without heart failure: Secondary | ICD-10-CM | POA: Diagnosis not present

## 2017-08-11 DIAGNOSIS — N183 Chronic kidney disease, stage 3 unspecified: Secondary | ICD-10-CM

## 2017-08-11 DIAGNOSIS — N281 Cyst of kidney, acquired: Secondary | ICD-10-CM | POA: Insufficient documentation

## 2017-08-11 DIAGNOSIS — N1330 Unspecified hydronephrosis: Secondary | ICD-10-CM | POA: Diagnosis not present

## 2017-08-11 DIAGNOSIS — I129 Hypertensive chronic kidney disease with stage 1 through stage 4 chronic kidney disease, or unspecified chronic kidney disease: Secondary | ICD-10-CM | POA: Insufficient documentation

## 2017-08-13 ENCOUNTER — Encounter: Payer: Self-pay | Admitting: Urology

## 2017-08-13 ENCOUNTER — Ambulatory Visit (INDEPENDENT_AMBULATORY_CARE_PROVIDER_SITE_OTHER): Payer: Medicare Other | Admitting: Urology

## 2017-08-13 VITALS — BP 199/84 | HR 86 | Ht 62.0 in | Wt 216.0 lb

## 2017-08-13 DIAGNOSIS — N1330 Unspecified hydronephrosis: Secondary | ICD-10-CM

## 2017-08-13 LAB — URINALYSIS, COMPLETE
BILIRUBIN UA: NEGATIVE
Glucose, UA: NEGATIVE
KETONES UA: NEGATIVE
Nitrite, UA: NEGATIVE
Protein, UA: NEGATIVE
RBC, UA: NEGATIVE
Specific Gravity, UA: 1.01 (ref 1.005–1.030)
Urobilinogen, Ur: 0.2 mg/dL (ref 0.2–1.0)
pH, UA: 5 (ref 5.0–7.5)

## 2017-08-15 NOTE — Progress Notes (Signed)
08/13/2017 9:34 AM   Gabrielle Evans April 17, 1941 [id number]  Referring provider: Leotis Shames, MD [address] [address] [address] [facility] [place], [place] [address]  Chief Complaint  Patient presents with  . Hydronephrosis    New Patient    HPI: Gabrielle Evans is a 76 year old female seen at the request of Dr. Thedore Mins for evaluation of left hydronephrosis.  She had a recent renal ultrasound ordered for evaluation of chronic kidney disease and was found to have moderate to severe left hydronephrosis with cortical thinning.  She is asymptomatic and denies flank/abdominal/pelvic pain.  She has had burning pain in the left mid thorax that radiates to the left breast.  She has no bothersome lower urinary tract symptoms.  Denies dysuria or gross hematuria.  She denies previous history of urologic problems.  A prior CT of the abdomen and pelvis performed in 2012 showed no hydronephrosis.   PMH: Past Medical History:  Diagnosis Date  . Anemia   . Aortic stenosis, mild   . Barrett's esophagus   . Diabetes mellitus without complication (HCC)   . Diabetic retinopathy (HCC)   . Hypertension   . Hyperthyroidism   . Irritable bowel syndrome   . Ischemic colitis (HCC)   . Obesity     Surgical History: Past Surgical History:  Procedure Laterality Date  . CARDIAC CATHETERIZATION    . CHOLECYSTECTOMY    . COLONOSCOPY WITH PROPOFOL N/A 06/30/2015   Procedure: COLONOSCOPY WITH PROPOFOL;  Surgeon: Scot Jun, MD;  Location: [facility] ENDOSCOPY;  Service: Endoscopy;  Laterality: N/A;  . ESOPHAGOGASTRODUODENOSCOPY (EGD) WITH PROPOFOL N/A 06/30/2015   Procedure: ESOPHAGOGASTRODUODENOSCOPY (EGD) WITH PROPOFOL;  Surgeon: Scot Jun, MD;  Location: [facility] ENDOSCOPY;  Service: Endoscopy;  Laterality: N/A;  . EYE SURGERY    . TONSILLECTOMY      Home Medications:  Allergies as of 08/13/2017      Reactions   Benazepril    Benicar [olmesartan] Shortness Of Breath   Coreg [carvedilol]    Cozaar  [losartan]    Lopressor [metoprolol Tartrate]    Micardis [telmisartan]    Norvasc [amlodipine]    Tiazac [diltiazem]    Toprol Xl [metoprolol]       Medication List        Accurate as of 08/13/17 11:59 PM. Always use your most recent med list.          ALPRAZolam 0.25 MG tablet Commonly known as:  XANAX Take 0.25 mg by mouth at bedtime as needed for anxiety.   aspirin EC 81 MG tablet Take 81 mg by mouth daily.   Calcium-Vitamin D-Vitamin K 500-100-40 MG-UNT-MCG Chew Chew 1 tablet by mouth daily.   dicyclomine 20 MG tablet Commonly known as:  BENTYL Take 20 mg by mouth every 6 (six) hours as needed for spasms.   DOCUSATE CALCIUM PO Take 3 tablets by mouth at bedtime as needed.   ferrous sulfate 325 (65 FE) MG tablet Take 325 mg by mouth daily with breakfast.   furosemide 20 MG tablet Commonly known as:  LASIX Take 20 mg by mouth daily. As needed for edema   hydrochlorothiazide 25 MG tablet Commonly known as:  HYDRODIURIL Take 25 mg by mouth daily.   irbesartan 150 MG tablet Commonly known as:  AVAPRO Take 150 mg by mouth daily.   latanoprost 0.005 % ophthalmic solution Commonly known as:  XALATAN Place 1 drop into both eyes at bedtime.   magnesium oxide 400 MG tablet Commonly known as:  MAG-OX Take 400 mg by mouth daily.   metFORMIN 1000 MG tablet Commonly known as:  GLUCOPHAGE Take 1,000 mg by mouth 2 (two) times daily with a meal. Take one tablet by mouth every morning and 1 and 1/2 tablet every evening.   multivitamin tablet Take 1 tablet by mouth daily.   OMEGA-3 FATTY ACIDS PO Take 1 capsule by mouth daily.   omeprazole 20 MG capsule Commonly known as:  PRILOSEC Take 20 mg by mouth daily.   potassium chloride 10 MEQ tablet Commonly known as:  K-DUR,KLOR-CON Take 10 mEq by mouth daily.   pregabalin 75 MG capsule Commonly known as:  LYRICA Take 75 mg by mouth 2 (two) times daily.   sodium chloride 5 % ophthalmic solution Commonly known  as:  MURO 128 Place 1 drop into both eyes as needed for irritation.   sucralfate 1 g tablet Commonly known as:  CARAFATE Take 1 g by mouth 4 (four) times daily -  with meals and at bedtime.       Allergies:  Allergies  Allergen Reactions  . Benazepril   . Benicar [Olmesartan] Shortness Of Breath  . Coreg [Carvedilol]   . Cozaar [Losartan]   . Lopressor [Metoprolol Tartrate]   . Micardis [Telmisartan]   . Norvasc [Amlodipine]   . Tiazac [Diltiazem]   . Toprol Xl [Metoprolol]     Family History: Family History  Problem Relation Age of Onset  . Kidney cancer Neg Hx   . Prostate cancer Neg Hx     Social History:  reports that she has quit smoking. she has never used smokeless tobacco. She reports that she drinks about 2.4 oz of alcohol per week. She reports that she does not use drugs.  Urological Symptom Review  Patient is experiencing the following symptoms: See HPI  Review of Systems  Gastrointestinal (upper)  : Negative for upper GI symptoms  Gastrointestinal (lower) : Negative for lower GI symptoms  Constitutional : Negative for symptoms  Skin: Negative for skin symptoms  Eyes: Negative for eye symptoms  Ear/Nose/Throat : Negative for Ear/Nose/Throat symptoms  Hematologic/Lymphatic: Negative for Hematologic/Lymphatic symptoms  Cardiovascular : Negative for cardiovascular symptoms  Respiratory : Negative for respiratory symptoms  Endocrine: Negative for endocrine symptoms  Musculoskeletal: Negative for musculoskeletal symptoms  Neurological: Negative for neurological symptoms  Psychologic: Negative for psychiatric symptoms   Physical Exam: BP (!) 199/84   Pulse 86   Ht 5\' 2"  (1.575 m)   Wt 216 lb (98 kg)   BMI 39.51 kg/m   Constitutional:  Alert and oriented, No acute distress. HEENT: [place] AT, moist mucus membranes.  Trachea midline, no masses. Cardiovascular: No clubbing, cyanosis, or edema. Respiratory: Normal respiratory  effort, no increased work of breathing. GI: Abdomen is soft, nontender, nondistended, no abdominal masses GU: No CVA tenderness.  1 ultrasound images were personally reviewed Skin: No rashes, bruises or suspicious lesions. Lymph: No cervical or inguinal adenopathy. Neurologic: Grossly intact, no focal deficits, moving all 4 extremities. Psychiatric: Normal mood and affect.  Laboratory Data: No results found for: WBC, HGB, HCT, MCV, PLT  Lab Results  Component Value Date   CREATININE 0.90 08/21/2016    Urinalysis Lab Results  Component Value Date   SPECGRAV 1.010 08/13/2017   PHUR 5.0 08/13/2017   COLORU Yellow 08/13/2017   APPEARANCEUR Clear 08/13/2017   LEUKOCYTESUR 1+ (A) 08/13/2017   PROTEINUR Negative 08/13/2017   GLUCOSEU Negative 08/13/2017   KETONESU Negative 08/13/2017   RBCU Negative 08/13/2017  BILIRUBINUR Negative 08/13/2017   UUROB 0.2 08/13/2017   NITRITE Negative 08/13/2017     Pertinent Imaging: Ultrasound images were personally reviewed.   Results for orders placed during the hospital encounter of 08/11/17  US RENAL   Narrative CLINICAL DATA:  Chronic renal disease.  EXAM: RENAL / URINARY TRACT ULTRASOUND COMPLETE  COMPARISON:  CT abdomen and pelvis 09/17/2011. Abdominal ultrasound 06/04/2007.  FINDINGS: Right Kidney:  Length: 11.3 cm. Echogenicity within normal limits. Two simple renal cysts are seen. The larger measures 2.5 cm in diameter. No hydronephrosis visualized.  Left Kidney:  Length: 12.2 cm. The cortex is thinned and its echogenicity appears increased. The patient has moderate to moderately severe hydronephrosis.  Bladder:  Appears normal for degree of bladder distention.  IMPRESSION: Moderate to moderately severe left hydronephrosis. Cause for obstruction is not identified.  Two small right renal cysts. The right kidney otherwise appears normal.   Electronically Signed   By: Drusilla Kannerhomas  Dalessio M.D.   On: 08/11/2017  14:59     Assessment & Plan:    1. Hydronephrosis, unspecified hydronephrosis type Moderate to severe left hydronephrosis new since 2012.  She is asymptomatic.  I recommended further imaging and since she has chronic kidney disease will initially start with a noncontrast CT of the abdomen and pelvis to evaluate for the possibility of a stone.  - Urinalysis, Complete - CT RENAL STONE STUDY; Future    Riki AltesScott C Stoioff, MD  [facility] [address], [address] [place], [place] [address] [phone]

## 2017-08-25 ENCOUNTER — Ambulatory Visit
Admission: RE | Admit: 2017-08-25 | Discharge: 2017-08-25 | Disposition: A | Discharge status: Home / Self Care | Payer: Medicare Other | Source: Ambulatory Visit | Attending: Urology | Admitting: Urology

## 2017-08-25 DIAGNOSIS — N281 Cyst of kidney, acquired: Secondary | ICD-10-CM | POA: Diagnosis not present

## 2017-08-25 DIAGNOSIS — I700 Atherosclerosis of aorta: Secondary | ICD-10-CM | POA: Diagnosis not present

## 2017-08-25 DIAGNOSIS — N1330 Unspecified hydronephrosis: Secondary | ICD-10-CM | POA: Diagnosis not present

## 2017-08-25 DIAGNOSIS — K760 Fatty (change of) liver, not elsewhere classified: Secondary | ICD-10-CM | POA: Insufficient documentation

## 2017-08-27 ENCOUNTER — Other Ambulatory Visit: Payer: Self-pay | Admitting: Urology

## 2017-08-27 DIAGNOSIS — N1330 Unspecified hydronephrosis: Secondary | ICD-10-CM

## 2017-08-29 ENCOUNTER — Encounter: Payer: Self-pay | Admitting: Urology

## 2017-08-29 ENCOUNTER — Ambulatory Visit (INDEPENDENT_AMBULATORY_CARE_PROVIDER_SITE_OTHER): Payer: Medicare Other | Admitting: Urology

## 2017-08-29 VITALS — Ht 62.0 in | Wt 220.8 lb

## 2017-08-29 DIAGNOSIS — Q6211 Congenital occlusion of ureteropelvic junction: Secondary | ICD-10-CM

## 2017-08-29 LAB — URINALYSIS, COMPLETE
Bilirubin, UA: NEGATIVE
GLUCOSE, UA: NEGATIVE
KETONES UA: NEGATIVE
NITRITE UA: NEGATIVE
PROTEIN UA: NEGATIVE
RBC, UA: NEGATIVE
Specific Gravity, UA: 1.01 (ref 1.005–1.030)
Urobilinogen, Ur: 0.2 mg/dL (ref 0.2–1.0)
pH, UA: 6 (ref 5.0–7.5)

## 2017-08-29 LAB — MICROSCOPIC EXAMINATION: RBC, UA: NONE SEEN /hpf (ref 0–?)

## 2017-09-02 ENCOUNTER — Encounter: Payer: Self-pay | Admitting: Urology

## 2017-09-02 DIAGNOSIS — Q6211 Congenital occlusion of ureteropelvic junction: Secondary | ICD-10-CM | POA: Insufficient documentation

## 2017-09-02 NOTE — Progress Notes (Signed)
08/29/2017 8:46 AM   Gabrielle Evans 01/24/1941 [id number]  Referring provider: Leotis ShamesSingh, Jasmine, MD [address] [facility] [place], [place] [id number]  Chief Complaint  Patient presents with  . Follow-up  . Results    CT Scan    HPI: Seen on 08/13/2017 for incidentally discovered left hydronephrosis.  She has chronic kidney disease and presents for review of her noncontrast CT scan.  The CT does show severe hydronephrosis with cortical thinning and no evidence of urolithiasis.  Consistent with UPJ obstruction however this was not present on a prior study performed in 2012.  No retroperitoneal masses noted.   PMH: Past Medical History:  Diagnosis Date  . Anemia   . Aortic stenosis, mild   . Barrett's esophagus   . Diabetes mellitus without complication (HCC)   . Diabetic retinopathy (HCC)   . Hypertension   . Hyperthyroidism   . Irritable bowel syndrome   . Ischemic colitis (HCC)   . Obesity     Surgical History: Past Surgical History:  Procedure Laterality Date  . CARDIAC CATHETERIZATION    . CHOLECYSTECTOMY    . COLONOSCOPY WITH PROPOFOL N/A 06/30/2015   Procedure: COLONOSCOPY WITH PROPOFOL;  Surgeon: Scot Junobert T Elliott, MD;  [facility];  Service: Endoscopy;  Laterality: N/A;  . ESOPHAGOGASTRODUODENOSCOPY (EGD) WITH PROPOFOL N/A 06/30/2015   Procedure: ESOPHAGOGASTRODUODENOSCOPY (EGD) WITH PROPOFOL;  Surgeon: Scot Junobert T Elliott, MD;  [facility];  Service: Endoscopy;  Laterality: N/A;  . EYE SURGERY    . TONSILLECTOMY      Home Medications:  Allergies as of 08/29/2017      Reactions   Benazepril    Benicar [olmesartan] Shortness Of Breath   Coreg [carvedilol]    Cozaar [losartan]    Lopressor [metoprolol Tartrate]    Micardis [telmisartan]    Norvasc [amlodipine]    Tiazac [diltiazem]    Toprol Xl [metoprolol]       Medication List        Accurate as of 08/29/17 11:59 PM. Always use your most recent med list.            aspirin EC 81 MG tablet Take 81 mg by mouth daily.   azelastine 0.1 % nasal spray Commonly known as:  ASTELIN Place 1 spray into both nostrils 2 (two) times daily. Use in each nostril as directed   DOCUSATE CALCIUM PO Take 3 tablets by mouth at bedtime as needed.   ferrous sulfate 325 (65 FE) MG tablet Take 325 mg by mouth daily with breakfast.   furosemide 20 MG tablet Commonly known as:  LASIX Take 20 mg by mouth daily. As needed for edema   hydrALAZINE 25 MG tablet Commonly known as:  APRESOLINE Take 25 mg by mouth daily.   hydrochlorothiazide 25 MG tablet Commonly known as:  HYDRODIURIL Take 25 mg by mouth daily.   insulin detemir 100 UNIT/ML injection Commonly known as:  LEVEMIR Inject 75 Units into the skin at bedtime.   irbesartan 150 MG tablet Commonly known as:  AVAPRO Take 150 mg by mouth daily.   latanoprost 0.005 % ophthalmic solution Commonly known as:  XALATAN Place 1 drop into both eyes at bedtime.   metFORMIN 1000 MG tablet Commonly known as:  GLUCOPHAGE Take 1,000 mg by mouth 2 (two) times daily with a meal. Take one tablet by mouth every morning and 1 and 1/2 tablet every evening.   multivitamin tablet Take 1 tablet by mouth daily.   OMEGA-3 FATTY  ACIDS PO Take 1 capsule by mouth daily.   omeprazole 20 MG capsule Commonly known as:  PRILOSEC Take 20 mg by mouth daily.   pregabalin 75 MG capsule Commonly known as:  LYRICA Take 75 mg by mouth 2 (two) times daily.   sodium chloride 5 % ophthalmic solution Commonly known as:  MURO 128 Place 1 drop into both eyes as needed for irritation.   SYSTANE 0.4-0.3 % Gel ophthalmic gel Generic drug:  Polyethyl Glycol-Propyl Glycol Place 1 application into both eyes.   VIACTIV 500-500-40 MG-UNT-MCG Chew Generic drug:  Calcium-Vitamin D-Vitamin K Chew 1 tablet by mouth daily.       Allergies:  Allergies  Allergen Reactions  . Benazepril   . Benicar [Olmesartan] Shortness Of Breath   . Coreg [Carvedilol]   . Cozaar [Losartan]   . Lopressor [Metoprolol Tartrate]   . Micardis [Telmisartan]   . Norvasc [Amlodipine]   . Tiazac [Diltiazem]   . Toprol Xl [Metoprolol]     Family History: Family History  Problem Relation Age of Onset  . Pancreatic cancer Father   . Diabetes Father   . Heart attack Father   . Deep vein thrombosis Mother   . Pulmonary embolism Mother   . Leukemia Maternal Aunt   . Obesity Brother   . Heart disease Brother   . Kidney cancer Neg Hx   . Prostate cancer Neg Hx   . Kidney disease Neg Hx   . Sickle cell trait Neg Hx   . Tuberculosis Neg Hx     Social History:  reports that she quit smoking about 34 years ago. she has never used smokeless tobacco. She reports that she drinks alcohol. She reports that she does not use drugs.  ROS: UROLOGY Frequent Urination?: Yes Hard to postpone urination?: Yes Burning/pain with urination?: No Get up at night to urinate?: Yes Leakage of urine?: No Urine stream starts and stops?: No Trouble starting stream?: No Do you have to strain to urinate?: No Blood in urine?: No Urinary tract infection?: No Sexually transmitted disease?: No Injury to kidneys or bladder?: No Painful intercourse?: No Weak stream?: No Currently pregnant?: No Vaginal bleeding?: No  Gastrointestinal Nausea?: No Vomiting?: No Indigestion/heartburn?: No Diarrhea?: No Constipation?: No  Constitutional Fever: No Night sweats?: No Weight loss?: No Fatigue?: No  Skin Skin rash/lesions?: No Itching?: No  Eyes Blurred vision?: No Double vision?: No  Ears/Nose/Throat Sore throat?: No Sinus problems?: No  Hematologic/Lymphatic Swollen glands?: No Easy bruising?: No  Cardiovascular Leg swelling?: No Chest pain?: No  Respiratory Cough?: No Shortness of breath?: No  Endocrine Excessive thirst?: No  Musculoskeletal Back pain?: No Joint pain?: No  Neurological Headaches?: No Dizziness?:  No  Psychologic Depression?: No Anxiety?: No  Physical Exam: Ht 5\' 2"  (1.575 m)   Wt 220 lb 12.8 oz (100.2 kg)   BMI 40.38 kg/m   Constitutional:  Alert and oriented, No acute distress. HEENT: [place] AT, moist mucus membranes.  Trachea midline, no masses. Cardiovascular: No clubbing, cyanosis, or edema. Respiratory: Normal respiratory effort, no increased work of breathing. GI: Abdomen is soft, nontender, nondistended, no abdominal masses GU: No CVA tenderness.  Skin: No rashes, bruises or suspicious lesions. Lymph: No cervical or inguinal adenopathy. Neurologic: Grossly intact, no focal deficits, moving all 4 extremities. Psychiatric: Normal mood and affect.  Laboratory Data: No results found for: WBC, HGB, HCT, MCV, PLT  Lab Results  Component Value Date   CREATININE 0.90 08/21/2016   Urinalysis Lab Results  Component  Value Date   SPECGRAV 1.010 08/29/2017   PHUR 6.0 08/29/2017   COLORU Yellow 08/29/2017   APPEARANCEUR Cloudy (A) 08/29/2017   LEUKOCYTESUR Trace (A) 08/29/2017   PROTEINUR Negative 08/29/2017   GLUCOSEU Negative 08/29/2017   KETONESU Negative 08/29/2017   RBCU Negative 08/29/2017   BILIRUBINUR Negative 08/29/2017   UUROB 0.2 08/29/2017   NITRITE Negative 08/29/2017    Lab Results  Component Value Date   LABMICR See below: 08/29/2017   WBCUA 0-5 08/29/2017   RBCUA None seen 08/29/2017   LABEPIT 0-10 08/29/2017   MUCUS Present (A) 08/29/2017   BACTERIA Few (A) 08/29/2017    Pertinent Imaging: CT was personally reviewed  Results for orders placed during the hospital encounter of 08/11/17  US RENAL   Narrative CLINICAL DATA:  Chronic renal disease.  EXAM: RENAL / URINARY TRACT ULTRASOUND COMPLETE  COMPARISON:  CT abdomen and pelvis 09/17/2011. Abdominal ultrasound 06/04/2007.  FINDINGS: Right Kidney:  Length: 11.3 cm. Echogenicity within normal limits. Two simple renal cysts are seen. The larger measures 2.5 cm in diameter.  No hydronephrosis visualized.  Left Kidney:  Length: 12.2 cm. The cortex is thinned and its echogenicity appears increased. The patient has moderate to moderately severe hydronephrosis.  Bladder:  Appears normal for degree of bladder distention.  IMPRESSION: Moderate to moderately severe left hydronephrosis. Cause for obstruction is not identified.  Two small right renal cysts. The right kidney otherwise appears normal.   Electronically Signed   By: Drusilla Kannerhomas  Dalessio M.D.   On: 08/11/2017 14:59    No results found for this or any previous visit. No results found for this or any previous visit. Results for orders placed during the hospital encounter of 08/25/17  CT RENAL STONE STUDY   Narrative CLINICAL DATA:  Left hydronephrosis on CT. Renal insufficiency. Transient left flank pain 1 month ago. No history of malignancy.  EXAM: CT ABDOMEN AND PELVIS WITHOUT CONTRAST  TECHNIQUE: Multidetector CT imaging of the abdomen and pelvis was performed following the standard protocol without IV contrast.  COMPARISON:  Abdominopelvic CT 09/17/2011. Abdominal ultrasound 06/03/2017 and renal ultrasound 08/11/2017.  FINDINGS: Lower chest: Stable 4 mm right lower lobe nodule on image 17, likely a lymph node or granuloma. The lung bases are otherwise clear. There is no pleural or pericardial effusion. There is dense calcification of the mitral annulus. Aortic and coronary artery atherosclerosis noted.  Hepatobiliary: The liver demonstrates diffuse low density consistent with steatosis. No focal lesions are identified on noncontrast imaging. No significant biliary dilatation status post cholecystectomy.  Pancreas: Unremarkable. No pancreatic ductal dilatation or surrounding inflammatory changes.  Spleen: Normal in size without focal abnormality. Stable small adjacent splenule.  Adrenals/Urinary Tract: Both adrenal glands appear normal. Bilateral renal cysts have mildly  enlarged compared with the prior study. There is a 3.0 cm lesion projecting laterally from the interpolar region of the right kidney on image 36 and a 6.3 x 4.6 cm lesion projecting anteriorly from the mid left kidney on image 34. There is no evidence of urinary tract calculus. As seen on recent ultrasound, there is moderate to severe pelvicaliectasis on the left with associated mild cortical thinning. The left ureter is normal in caliber. No causative retroperitoneal mass identified. There is no asymmetric perinephric soft tissue stranding. There is probable mild pelvic floor laxity. The bladder otherwise appears unremarkable.  Stomach/Bowel: No evidence of bowel wall thickening, distention or surrounding inflammatory change. Moderate stool throughout the colon.  Vascular/Lymphatic: There are no enlarged abdominal or pelvic  lymph nodes. Small lymph nodes in the porta hepatis are stable. There is moderate aortic and branch vessel atherosclerosis. No acute vascular findings are seen on noncontrast imaging.  Reproductive: Uterine atrophy and probable pelvic floor laxity. No adnexal mass.  Other: Soft tissue stranding in the subcutaneous fat of the anterior abdominal wall, probably from subcutaneous injections. Small umbilical hernia containing only fat. No ascites.  Musculoskeletal: No acute or significant osseous findings. There are stable scattered bone islands in the spine, pelvis and proximal femurs. Facet disease present in the lower lumbar spine.  IMPRESSION: 1. 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 No specific cause is demonstrated. At the least, correlation with urine cytology recommended. Retrograde ureterography should be considered for diagnostic and therapeutic reasons. 2. No evidence of urinary tract calculus or retroperitoneal mass. 3. Bilateral renal cysts. 4. Hepatic steatosis. 5.  Aortic  Atherosclerosis (ICD10-I70.0).   Electronically Signed   By: Carey Bullocks M.D.   On: 08/25/2017 14:29     Assessment & Plan:    1. 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  New since 2012.  She has left thoracic pain however it is difficult to determine if this is secondary to her renal findings.  Will schedule a diuretic renogram.  The likelihood of cystoscopy with left retrograde pyelogram and possible ureteroscopy was also discussed.  - Urinalysis, Complete    Riki Altes, MD  [facility] [address], [address] [place], [place] [address] [phone]

## 2017-09-03 ENCOUNTER — Ambulatory Visit: Payer: Medicare Other | Admitting: Urology

## 2017-09-03 ENCOUNTER — Telehealth: Payer: Self-pay

## 2017-09-03 NOTE — Telephone Encounter (Signed)
-----   Message from Riki AltesScott C Stoioff, MD sent at 08/27/2017 12:27 PM EST ----- CT does show evidence of possible blockage of the left kidney.  No stones are present.  Recommend scheduling a diuretic renogram follow-up visit.  Order was entered.

## 2017-09-03 NOTE — Telephone Encounter (Signed)
LMOM

## 2017-09-09 NOTE — Telephone Encounter (Signed)
Not able to get in touch with pt. Will send a letter to ensure she is aware of why she is scheduling scan.

## 2017-09-24 ENCOUNTER — Telehealth: Payer: Self-pay | Admitting: Urology

## 2017-09-24 NOTE — Telephone Encounter (Signed)
Scheduling called the patient to schedule and she did not want to schedule  Gabrielle Evans

## 2017-10-01 ENCOUNTER — Ambulatory Visit: Payer: Medicare Other | Admitting: Urology

## 2017-10-15 NOTE — Telephone Encounter (Signed)
Please let patient know without this study she could have progressive kidney damage.

## 2017-10-15 NOTE — Telephone Encounter (Signed)
No answer. Will send a certified letter. Tracking #- [id number].

## 2017-10-20 ENCOUNTER — Telehealth: Payer: Self-pay | Admitting: Urology

## 2017-10-20 NOTE — Telephone Encounter (Signed)
Pt called and left a message about a letter from [place].  She said she would not be seeing Dr Lonna CobbStoioff anymore, she found a new urologist.

## 2018-11-20 IMAGING — CT CT RENAL STONE PROTOCOL
1 of 2 series · 14 of 32 positions shown, 18 images · non-contrast
Comparison: Abdominopelvic CT 09/17/2011. Abdominal ultrasound
06/03/2017 and renal ultrasound 08/11/2017.

CLINICAL DATA: Left hydronephrosis on CT. Renal insufficiency.
Transient left flank pain 1 month ago. No history of malignancy.

EXAM:
CT ABDOMEN AND PELVIS WITHOUT CONTRAST
TECHNIQUE: Multidetector CT imaging of the abdomen and pelvis was performed
following the standard protocol without IV contrast.

[Series 2: axial st · axial · 0.82mm/px · z∈[-935,-505]mm · 14 of 94 slices shown, 18 images]
[im 4/94  soft-tissue]
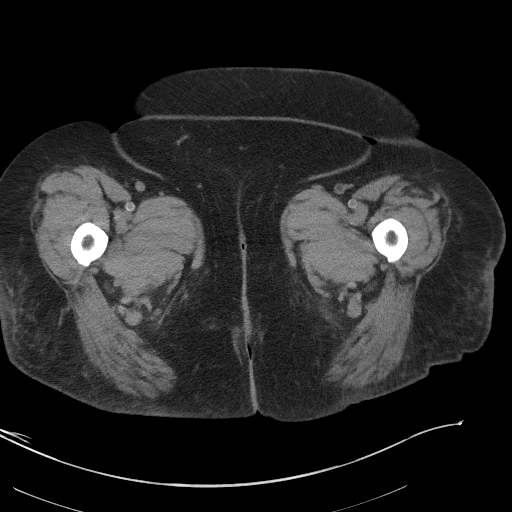
[im 4/94  bone]
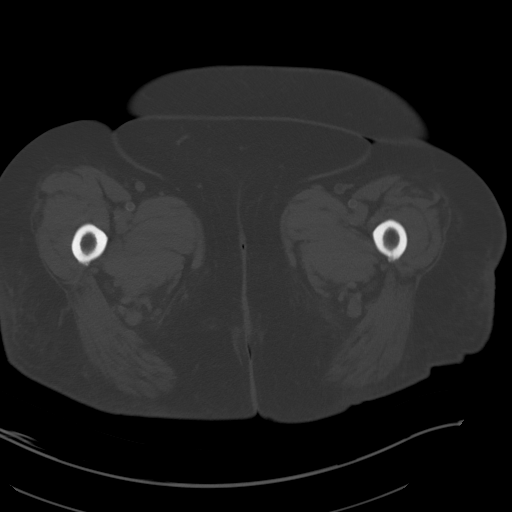
[im 12/94  soft-tissue]
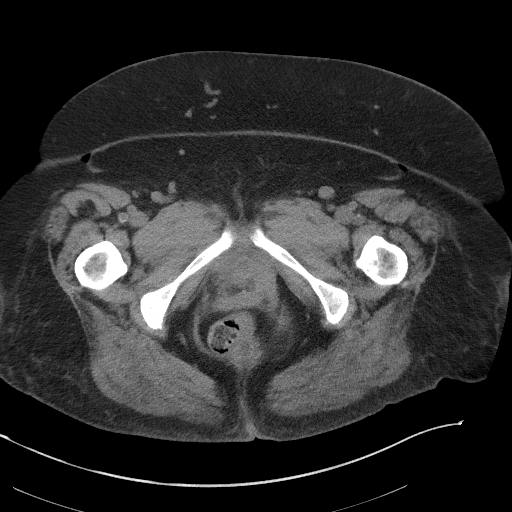
[im 20/94  soft-tissue]
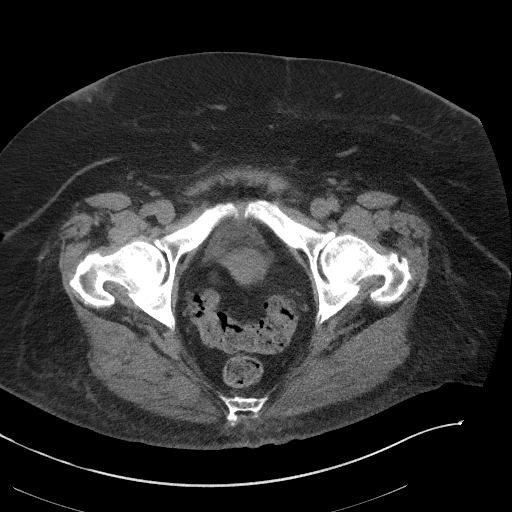
[im 28/94  soft-tissue]
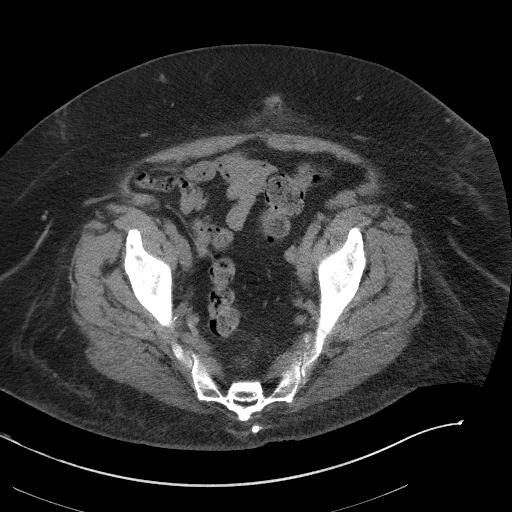
[im 35/94  soft-tissue]
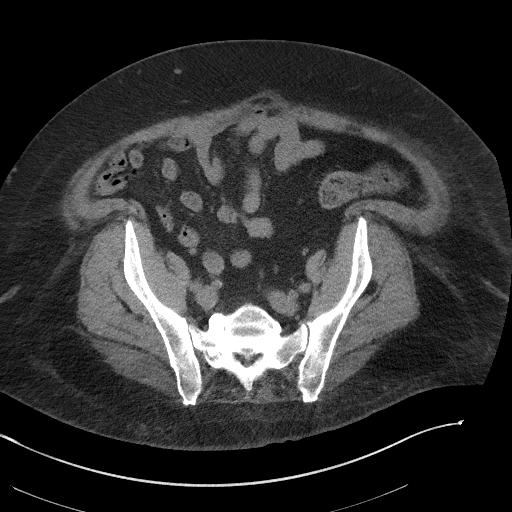
[im 43/94  soft-tissue]
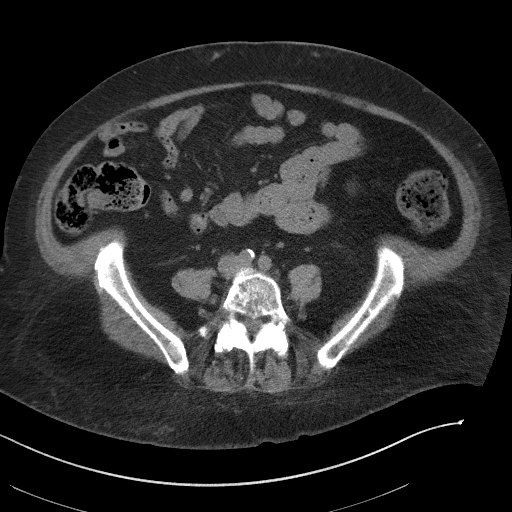
[im 51/94  soft-tissue]
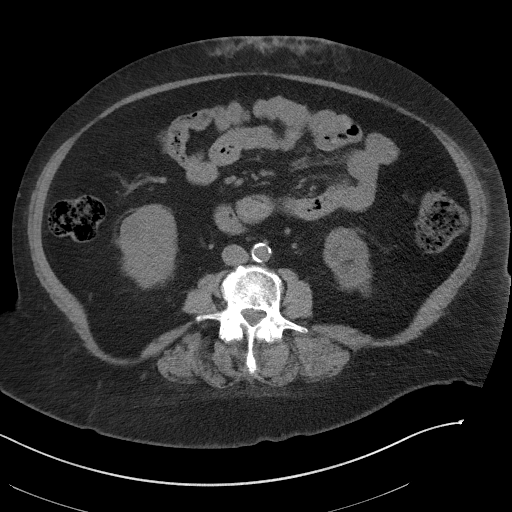
[im 59/94  soft-tissue]
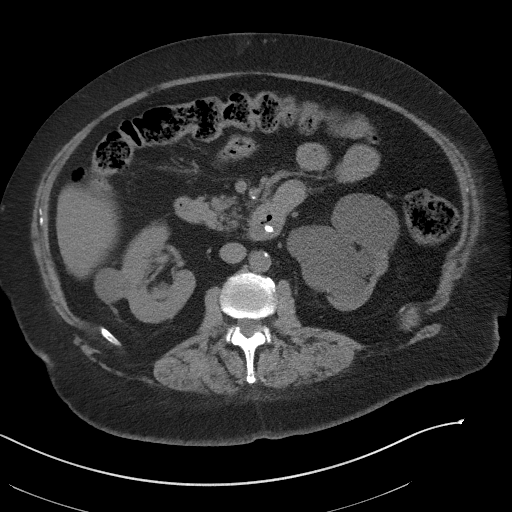
[im 66/94  soft-tissue]
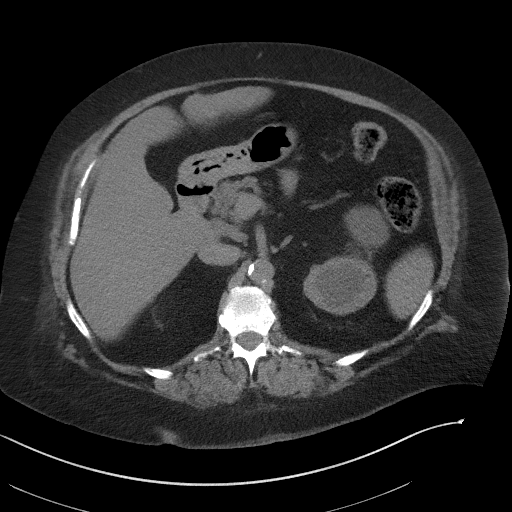
[im 66/94  bone]
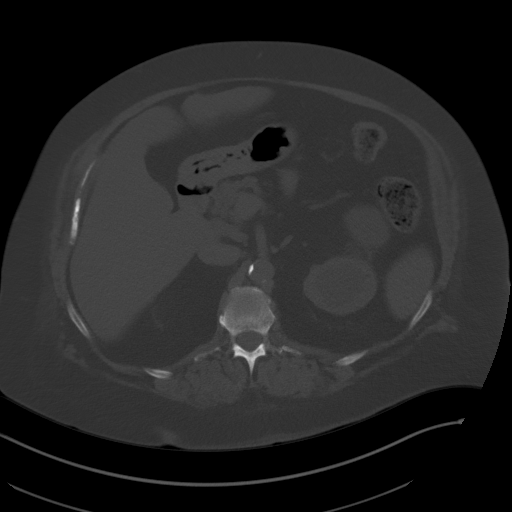
[im 74/94  soft-tissue]
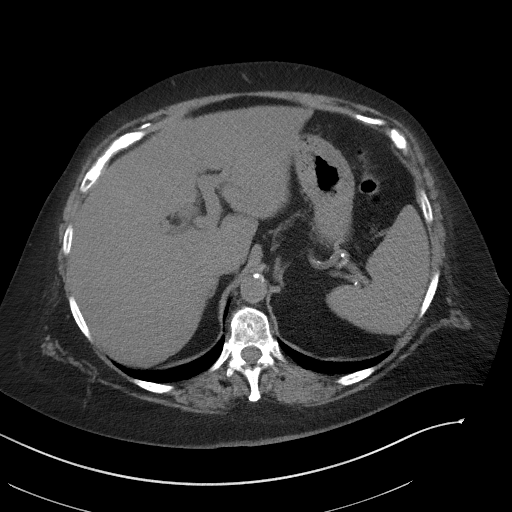
[im 78/94  lung]
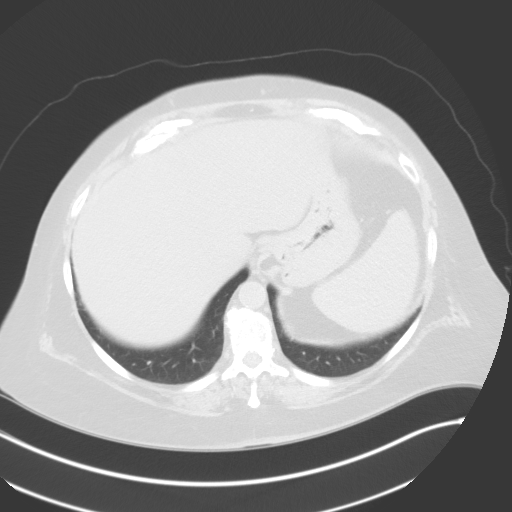
[im 82/94  soft-tissue]
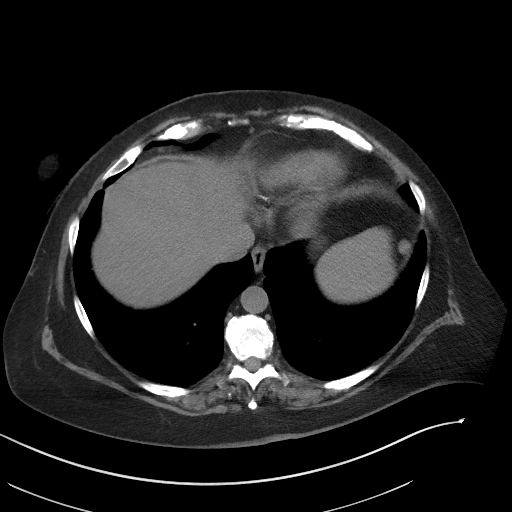
[im 82/94  lung]
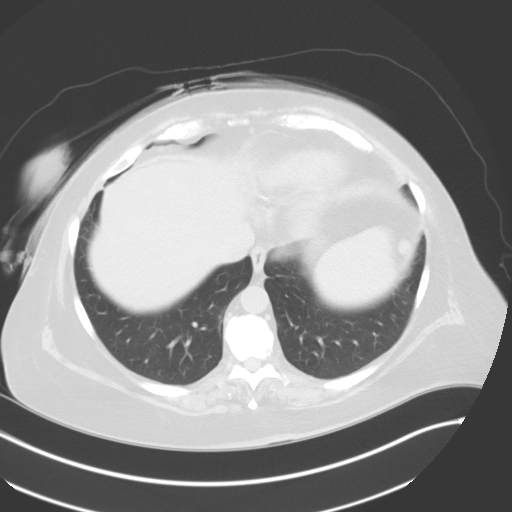
[im 86/94  lung]
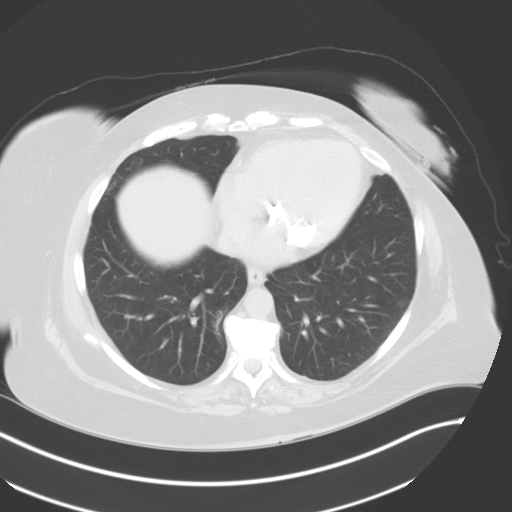
[im 90/94  soft-tissue]
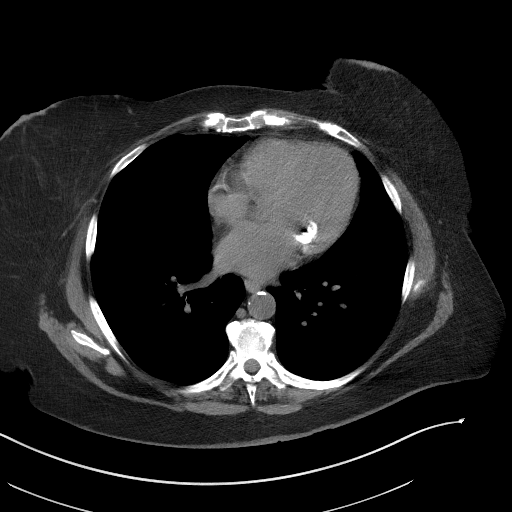
[im 90/94  lung]
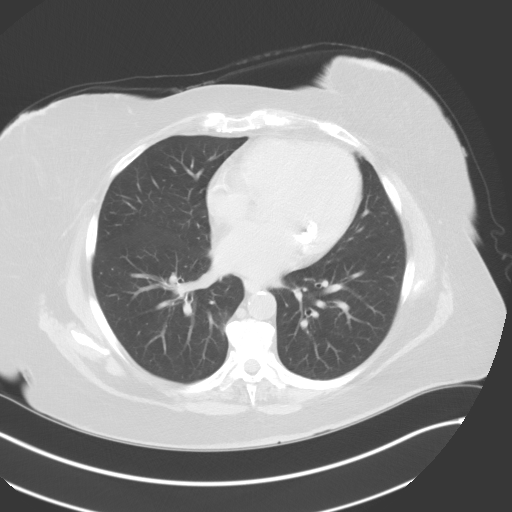

[14 of 32 positions shown; findings below may reference images not displayed]

FINDINGS: Lower chest: Stable 4 mm right lower lobe nodule on image 17, likely
a lymph node or granuloma. The lung bases are otherwise clear. There
is no pleural or pericardial effusion. There is dense calcification
of the mitral annulus. Aortic and coronary artery atherosclerosis
noted.

Hepatobiliary: The liver demonstrates diffuse low density consistent
with steatosis. No focal lesions are identified on noncontrast
imaging. No significant biliary dilatation status post
cholecystectomy.

Pancreas: Unremarkable. No pancreatic ductal dilatation or
surrounding inflammatory changes.

Spleen: Normal in size without focal abnormality. Stable small
adjacent splenule.

Adrenals/Urinary Tract: Both adrenal glands appear normal. Bilateral
renal cysts have mildly enlarged compared with the prior study.
There is a 3.0 cm lesion projecting laterally from the interpolar
region of the right kidney on image 36 and a 6.3 x 4.6 cm lesion
projecting anteriorly from the mid left kidney on image 34. There is
no evidence of urinary tract calculus. As seen on recent ultrasound,
there is moderate to severe pelvicaliectasis on the left with
associated mild cortical thinning. The left ureter is normal in
caliber. No causative retroperitoneal mass identified. There is no
asymmetric perinephric soft tissue stranding. There is probable mild
pelvic floor laxity. The bladder otherwise appears unremarkable.

Stomach/Bowel: No evidence of bowel wall thickening, distention or
surrounding inflammatory change. Moderate stool throughout the
colon.

Vascular/Lymphatic: There are no enlarged abdominal or pelvic lymph
nodes. Small lymph nodes in the porta hepatis are stable. There is
moderate aortic and branch vessel atherosclerosis. No acute vascular
findings are seen on noncontrast imaging.

Reproductive: Uterine atrophy and probable pelvic floor laxity. No
adnexal mass.

Other: Soft tissue stranding in the subcutaneous fat of the anterior
abdominal wall, probably from subcutaneous injections. Small
umbilical hernia containing only fat. No ascites.

Musculoskeletal: No acute or significant osseous findings. There are
stable scattered bone islands in the spine, pelvis and proximal
femurs. Facet disease present in the lower lumbar spine.
IMPRESSION: 1. Findings are consistent with left UPJ stenosis, a finding which
is new compared with previous CT from 9239. No specific cause is
demonstrated. At the least, correlation with urine cytology
recommended. Retrograde ureterography should be considered for
diagnostic and therapeutic reasons.
2. No evidence of urinary tract calculus or retroperitoneal mass.
3. Bilateral renal cysts.
4. Hepatic steatosis.
5.  Aortic Atherosclerosis ([id number]).

## 2019-02-15 IMAGING — US US RENAL
1 series · 14 of 25 positions shown · non-contrast
Comparison: CT abdomen and pelvis 09/17/2011. Abdominal ultrasound
06/04/2007.

CLINICAL DATA: Chronic renal disease.

EXAM:
RENAL / URINARY TRACT ULTRASOUND COMPLETE

[Series 1: us renal · 0.23mm/px · 14 of 43 slices shown]
[im 1/43]
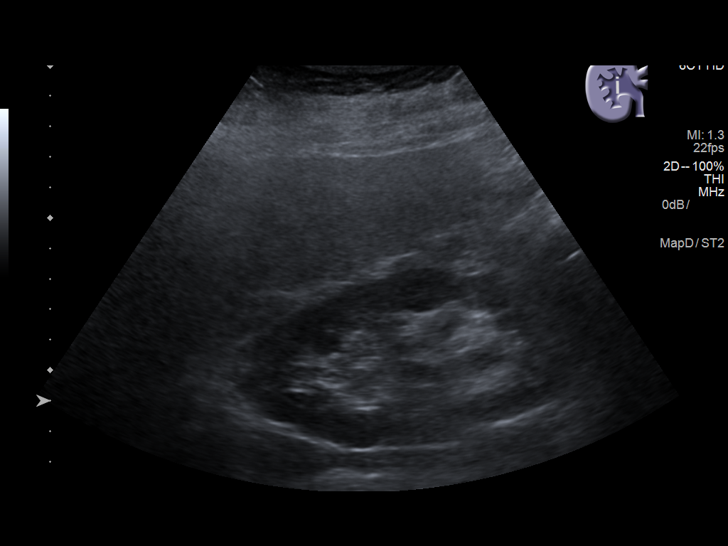
[im 4/43]
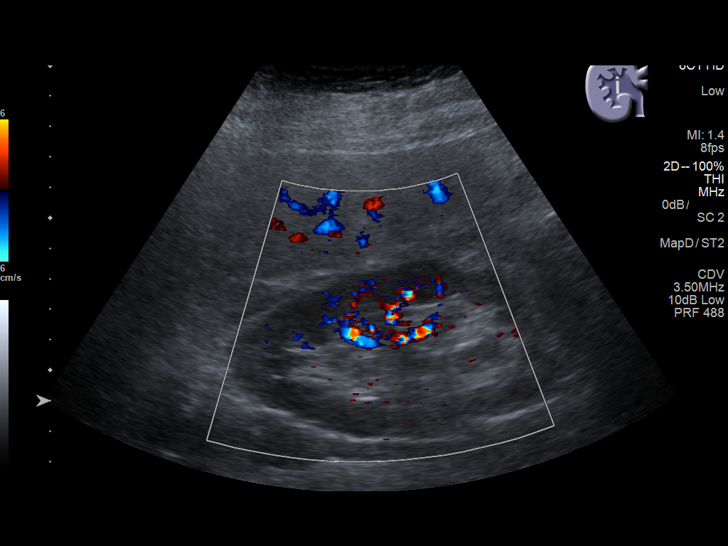
[im 8/43]
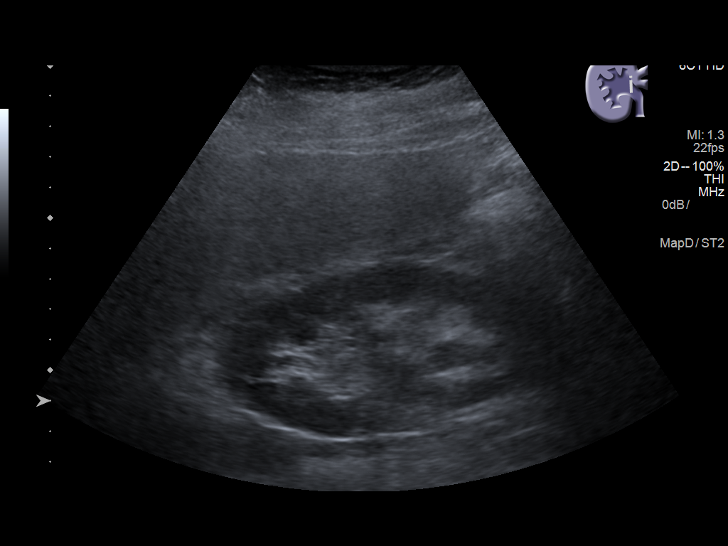
[im 11/43]
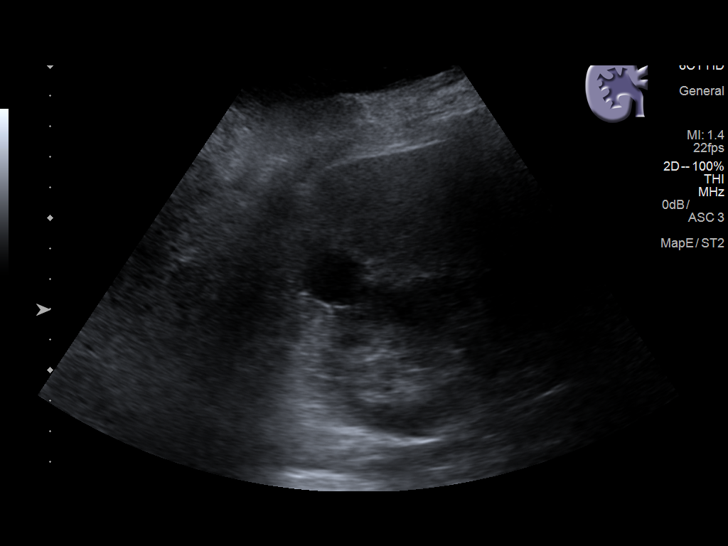
[im 15/43]
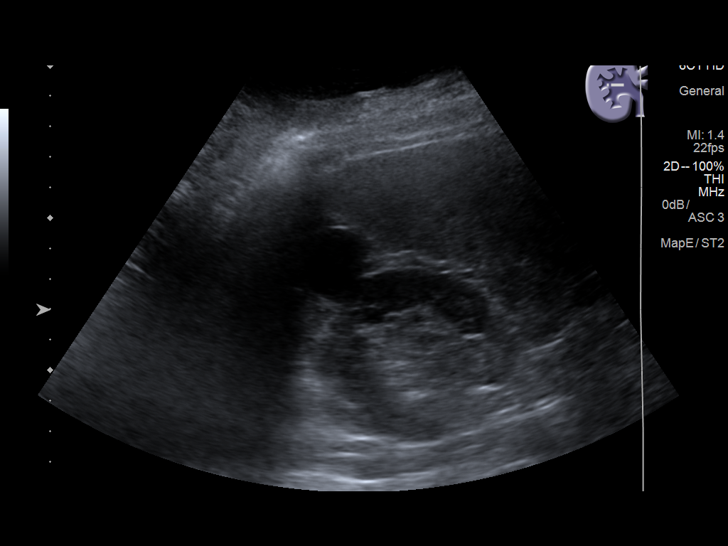
[im 16/43]
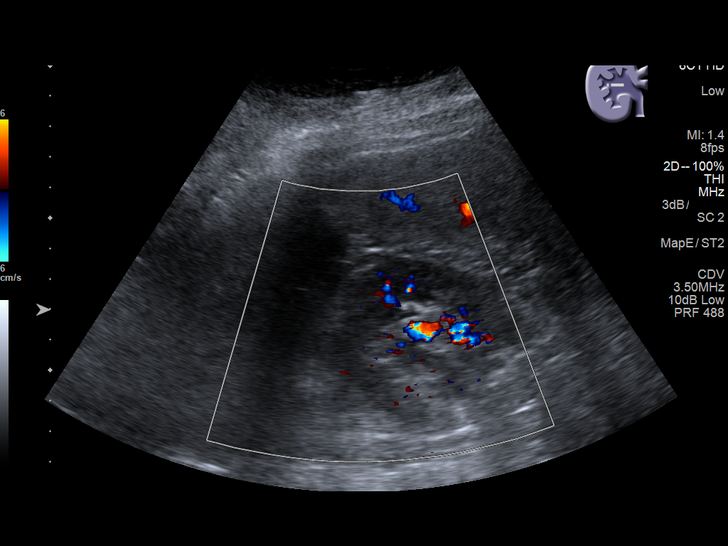
[im 20/43]
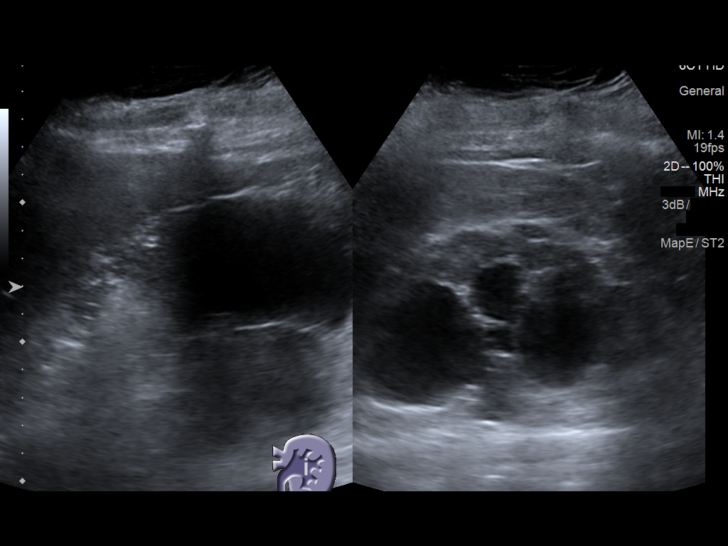
[im 23/43]
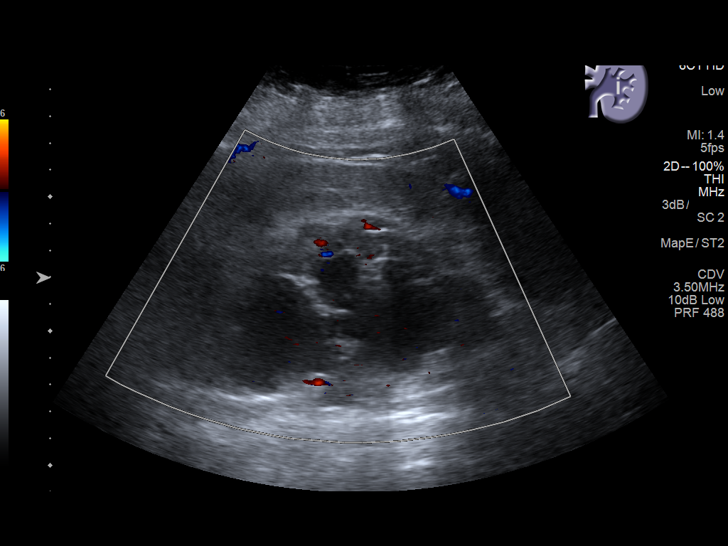
[im 27/43]
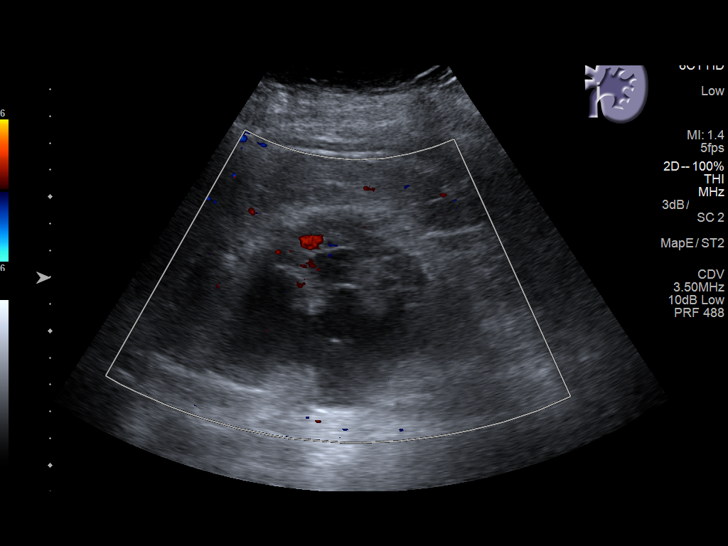
[im 29/43]
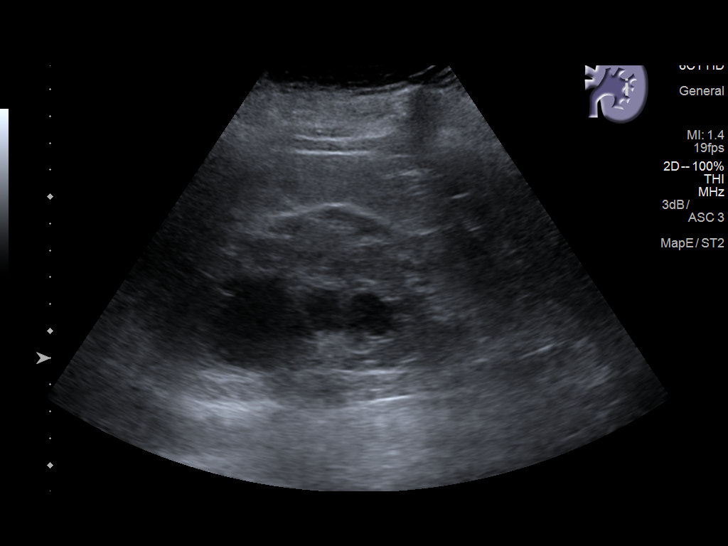
[im 32/43]
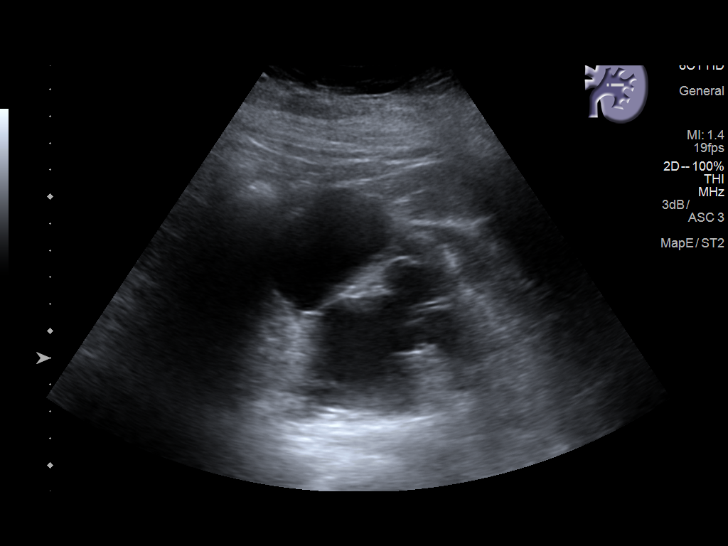
[im 36/43]
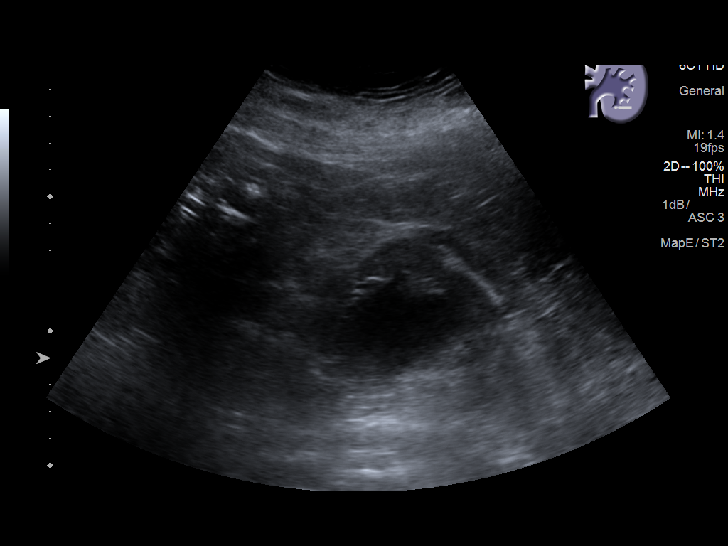
[im 39/43]
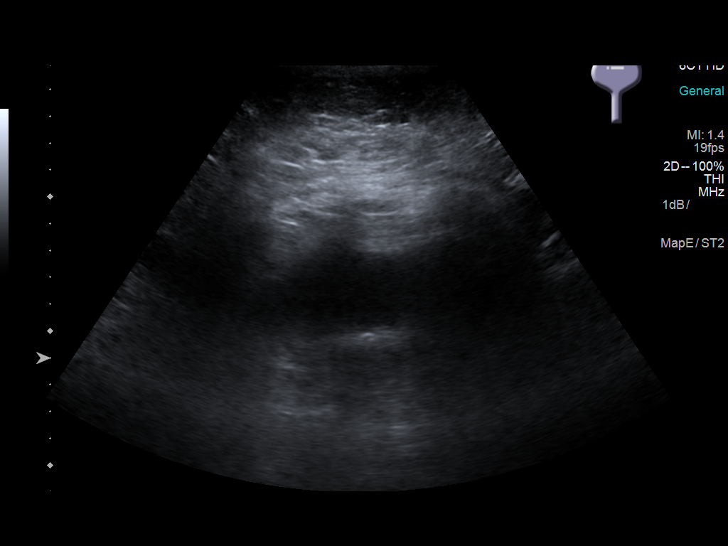
[im 43/43]
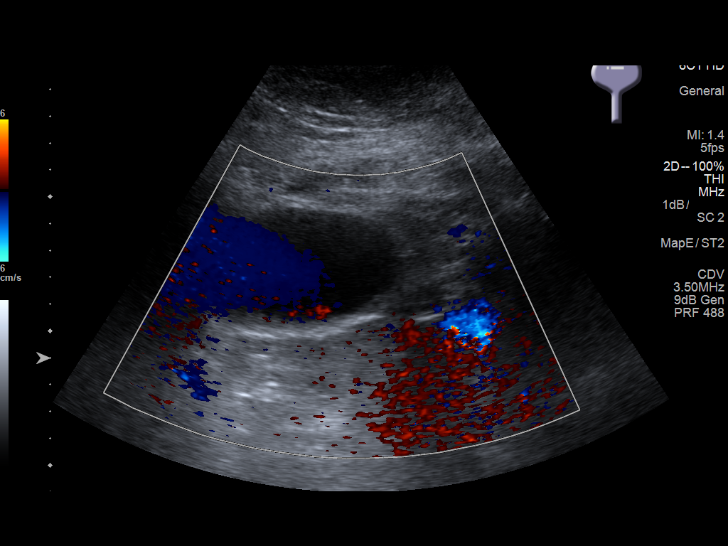

[14 of 25 positions shown; findings below may reference images not displayed]

FINDINGS: Right Kidney:

Length: 11.3 cm. Echogenicity within normal limits. Two simple renal
cysts are seen. The larger measures 2.5 cm in diameter. No
hydronephrosis visualized.

Left Kidney:

Length: 12.2 cm.. The cortex is thinned and its echogenicity appears
increased. The patient has moderate to moderately severe
hydronephrosis.

Bladder:

Appears normal for degree of bladder distention.
IMPRESSION: Moderate to moderately severe left hydronephrosis. Cause for
obstruction is not identified.

Two small right renal cysts. The right kidney otherwise appears
normal.

## 2019-03-05 ENCOUNTER — Other Ambulatory Visit: Payer: Self-pay | Admitting: Physician Assistant

## 2019-03-05 ENCOUNTER — Other Ambulatory Visit: Payer: Self-pay

## 2019-03-05 ENCOUNTER — Ambulatory Visit
Admission: RE | Admit: 2019-03-05 | Discharge: 2019-03-05 | Disposition: A | Discharge status: Home / Self Care | Payer: Medicare Other | Source: Ambulatory Visit | Attending: Physician Assistant | Admitting: Physician Assistant

## 2019-03-05 DIAGNOSIS — R1031 Right lower quadrant pain: Secondary | ICD-10-CM

## 2019-03-05 DIAGNOSIS — R935 Abnormal findings on diagnostic imaging of other abdominal regions, including retroperitoneum: Secondary | ICD-10-CM

## 2019-03-16 ENCOUNTER — Ambulatory Visit
Admission: RE | Admit: 2019-03-16 | Discharge: 2019-03-16 | Disposition: A | Discharge status: Home / Self Care | Payer: Medicare Other | Source: Ambulatory Visit | Attending: Physician Assistant | Admitting: Physician Assistant

## 2019-03-16 ENCOUNTER — Other Ambulatory Visit: Payer: Self-pay

## 2019-03-16 DIAGNOSIS — R935 Abnormal findings on diagnostic imaging of other abdominal regions, including retroperitoneum: Secondary | ICD-10-CM

## 2019-03-23 ENCOUNTER — Ambulatory Visit
Admission: RE | Admit: 2019-03-23 | Discharge: 2019-03-23 | Disposition: A | Discharge status: Home / Self Care | Payer: Medicare Other | Source: Ambulatory Visit | Attending: Urology | Admitting: Urology

## 2019-03-23 ENCOUNTER — Other Ambulatory Visit: Payer: Self-pay

## 2019-03-23 ENCOUNTER — Encounter: Payer: Self-pay | Admitting: Urology

## 2019-03-23 ENCOUNTER — Ambulatory Visit: Payer: Medicare Other | Admitting: Urology

## 2019-03-23 VITALS — BP 158/76 | HR 105 | Ht 60.0 in | Wt 218.0 lb

## 2019-03-23 DIAGNOSIS — N2889 Other specified disorders of kidney and ureter: Secondary | ICD-10-CM | POA: Insufficient documentation

## 2019-03-23 DIAGNOSIS — Q6211 Congenital occlusion of ureteropelvic junction: Secondary | ICD-10-CM | POA: Diagnosis not present

## 2019-03-23 NOTE — Progress Notes (Signed)
03/23/2019 2:26 PM   MACKLYN GLANDON 23-Jul-1941 [id number]  Reason for visit: Right renal mass  HPI: Ms. Yniguez is a 78 year old female with past medical history notable for obesity and diabetes who presents for evaluation of a right renal mass.  She has a complex urologic history and was previously followed by Dr. Bernardo Heater, Dr. Jacqlyn Larsen, and Dr. Thurmond Butts at [facility].  She was found to have severe left hydronephrosis and an atrophic left kidney on CT in December 2018 that was new from 2012.  This was worked up by Dr. Jacqlyn Larsen with diagnostic ureteroscopy which suggested a UPJ obstruction and stent placement.  Renal scan demonstrated 13% function of the left kidney.  Dr. Jacqlyn Larsen and Dr. Thurmond Butts had agreed to manage her conservatively with observation.  She has not had left-sided flank pain or UTIs.  She presents today as she had approximately a week of right-sided flank and groin pain and she was worried her right kidney may be the source.  She underwent a CT and renal ultrasound that demonstrated no hydronephrosis or nephrolithiasis, however there was an ill-defined 2.4 cm possible renal mass seen on CT.  On review of prior imaging, this appears relatively stable since at least 2018.  She did have a CT urogram performed in 2019 that showed only subtle enhancement of this lesion from 24HU to 35 HU.   ROS: Please see flowsheet from today's date for complete review of systems.  Physical Exam: BP (!) 158/76 (BP Location: Left Arm, Patient Position: Sitting)   Pulse (!) 105   Ht 5' (1.524 m)   Wt 218 lb (98.9 kg)   BMI 42.58 kg/m    Constitutional:  Alert and oriented, No acute distress. Respiratory: Normal respiratory effort, no increased work of breathing. GI: Abdomen is soft, nontender, nondistended, no abdominal masses GU: No CVA tenderness Skin: No rashes, bruises or suspicious lesions. Neurologic: Grossly intact, no focal deficits, moving all 4 extremities. Psychiatric: Normal mood and affect   Laboratory Data: 02/2019: Creatinine 1.1, EGFR 48  Pertinent Imaging: I have personally reviewed the renal ultrasound and CT imaging.  Relatively stable right lower pole 2.5 cm renal mass, mild enhancement on CT urogram from 2019.  Assessment & Plan:   In summary, the patient is a 78 year old female with a number of comorbidities who presents to discuss her right-sided renal mass.  This is approximately 2.5 cm in size on recent CT, and relatively stable from imaging in 2018.  CT urogram in 2019 showed subtle enhancement of 11HU.  She also has a known severely hydronephrotic and atrophic left kidney with 13% function that has been monitored with observation.  We discussed the etiology and management of renal masses at length today.  We also discussed the stability of her mass on imaging over the last 2 years.  Options for management include further imaging with MRI, renal mass biopsy, percutaneous ablation, or partial nephrectomy.  I strongly recommended ongoing surveillance with her comorbidities, small renal mass size, and stability on imaging over the last 2 years, and she is in agreement.  She does not think she can tolerate an MRI secondary to severe claustrophobia, and I think a repeat ultrasound in 9 months is reasonable with all the cross-sectional imaging she has had in the last 2 years.  She would very much like to avoid any intervention if possible with her other medical problems and stability of the lesion.  Chest x-ray today to complete staging RTC 9 months for renal ultrasound to  evaluate change in size of right renal mass, likely can space to yearly ultrasound if stable at that time  A total of 25 minutes were spent face-to-face with the patient, greater than 50% was spent in patient education, counseling, and coordination of care regarding left UPJ obstruction and right renal mass.   Billey Co, [facility] [address], [place]  [place], [place] [address] [phone]

## 2019-03-23 NOTE — Patient Instructions (Signed)
Renal Mass  A renal mass is a growth in the kidney. A renal mass may be found while performing an MRI, CT scan, or ultrasound for other problems of the abdomen. Certain types of cancers, infections, or injuries can cause a renal mass. A renal mass that is cancerous (malignant) may grow or spread quickly. Others are harmless (benign). What are common types of renal masses? Renal masses include:  Tumors. These may be cancerous (malignant) or noncancerous (benign). ? The most common type of kidney cancer is renal cell carcinoma. ? The most common benign tumors of the kidney include renal adenomas, oncocytomas, and angiomyolipoma (AML).  Cysts. These are fluid-filled sacs that form on or in the kidney. ? It is not always known what causes a cyst to develop in or on the kidney. ? Most kidney cysts do not cause symptoms and do not need to be treated. What type of testing might I need? Your health care provider may recommend that you have tests to diagnose the cause of your renal mass. The following tests may be done if a renal mass is found:  Physical exam.  Blood tests.  Urine tests.  Imaging tests, such as ultrasound, CT scan, or MRI.  Biopsy. This is a small sample that is removed from the renal mass and tested in a lab. The exact tests and how often they are done will depend on:  The size and appearance of the renal mass.  Risk factors or medical conditions that increase your risk for problems.  Any symptoms associated with the renal mass, or concerns that you have about it. Tests and physical exams may be done once, or they may be done regularly for a period of time. Tests and exams that are done regularly will help monitor whether the mass is growing and beginning to cause problems. What are common treatments for renal masses? Treatment is not always needed for this condition. Your health care provider may recommend careful monitoring (watchful waiting) and regular tests and exams.  Treatment will depend on the cause of the mass. Follow these instructions at home: What you need to do at home will depend on the cause of the mass. Follow the instructions that your health care provider gives to you. In general:  Take over-the-counter and prescription medicines only as told by your health care provider.  If you are prescribed an antibiotic medicine, take it as told by your health care provider. Do not stop taking the antibiotic even if you start to feel better.  Follow any restrictions that are given to you by your health care provider.  Keep all follow-up visits as told by your health care provider. This is important. ? You may need to see your health care provider once or twice a year to have CT scans and ultrasounds done. These tests will show if your renal mass has changed or grown bigger. Contact a health care provider if you:  Have pain in the side or back (flank pain).  Have a fever.  Feel full soon after eating.  Have pain or swelling in the abdomen.  Lose weight. Get help right away if:  Your pain gets worse.  There is blood in your urine.  You cannot urinate.  You have chest pain.  You have trouble breathing. Summary  A renal mass is a growth in the kidney. It may be cancerous (malignant) and grow or spread quickly, or it may be harmless (benign).  Renal masses may be found while performing   an MRI, CT scan, or ultrasound for other problems of the abdomen.  Your health care provider may recommend that you have tests to diagnose the cause of your renal mass. This may include a physical exam, blood tests, urine tests, imaging, or a biopsy.  Treatment is not always needed for this condition. Careful monitoring (watchful waiting) may be recommended. This information is not intended to replace advice given to you by your health care provider. Make sure you discuss any questions you have with your health care provider. Document Released: 04/06/2014  Document Revised: 10/16/2017 Document Reviewed: 10/16/2017 Elsevier Patient Education  2020 [employer]  

## 2019-03-24 ENCOUNTER — Telehealth: Payer: Self-pay

## 2019-03-24 NOTE — Telephone Encounter (Signed)
Patient notified

## 2019-03-24 NOTE — Telephone Encounter (Signed)
-----   Message from Billey Co, MD sent at 03/24/2019  8:25 AM EDT ----- CXR normal, follow up as scheduled  Nickolas Madrid, MD 03/24/2019

## 2019-12-29 ENCOUNTER — Ambulatory Visit: Payer: Medicare Other | Admitting: Urology

## 2020-07-12 ENCOUNTER — Other Ambulatory Visit
Admission: RE | Admit: 2020-07-12 | Discharge: 2020-07-12 | Disposition: A | Discharge status: Home / Self Care | Payer: Medicare PPO | Source: Ambulatory Visit | Attending: Physician Assistant | Admitting: Physician Assistant

## 2020-07-12 DIAGNOSIS — R001 Bradycardia, unspecified: Secondary | ICD-10-CM | POA: Insufficient documentation

## 2020-07-12 LAB — TROPONIN I (HIGH SENSITIVITY): Troponin I (High Sensitivity): 8 ng/L (ref <18)

## 2020-07-20 ENCOUNTER — Encounter: Payer: Self-pay | Admitting: Intensive Care

## 2020-07-20 ENCOUNTER — Inpatient Hospital Stay
Admission: EM | Admit: 2020-07-20 | Discharge: 2020-07-25 | DRG: 229 | Disposition: A | Discharge status: Home / Self Care | Payer: Medicare PPO | Attending: Internal Medicine | Admitting: Internal Medicine

## 2020-07-20 ENCOUNTER — Other Ambulatory Visit: Payer: Self-pay

## 2020-07-20 ENCOUNTER — Emergency Department: Payer: Medicare PPO

## 2020-07-20 DIAGNOSIS — Z23 Encounter for immunization: Secondary | ICD-10-CM | POA: Diagnosis not present

## 2020-07-20 DIAGNOSIS — I442 Atrioventricular block, complete: Principal | ICD-10-CM | POA: Diagnosis present

## 2020-07-20 DIAGNOSIS — K589 Irritable bowel syndrome without diarrhea: Secondary | ICD-10-CM | POA: Diagnosis present

## 2020-07-20 DIAGNOSIS — D508 Other iron deficiency anemias: Secondary | ICD-10-CM | POA: Diagnosis present

## 2020-07-20 DIAGNOSIS — Z79899 Other long term (current) drug therapy: Secondary | ICD-10-CM | POA: Diagnosis not present

## 2020-07-20 DIAGNOSIS — I350 Nonrheumatic aortic (valve) stenosis: Secondary | ICD-10-CM | POA: Diagnosis present

## 2020-07-20 DIAGNOSIS — Z7984 Long term (current) use of oral hypoglycemic drugs: Secondary | ICD-10-CM

## 2020-07-20 DIAGNOSIS — Z7982 Long term (current) use of aspirin: Secondary | ICD-10-CM

## 2020-07-20 DIAGNOSIS — Z87891 Personal history of nicotine dependence: Secondary | ICD-10-CM | POA: Diagnosis not present

## 2020-07-20 DIAGNOSIS — Z794 Long term (current) use of insulin: Secondary | ICD-10-CM

## 2020-07-20 DIAGNOSIS — Z6841 Body Mass Index (BMI) 40.0 and over, adult: Secondary | ICD-10-CM

## 2020-07-20 DIAGNOSIS — Z8249 Family history of ischemic heart disease and other diseases of the circulatory system: Secondary | ICD-10-CM

## 2020-07-20 DIAGNOSIS — D649 Anemia, unspecified: Secondary | ICD-10-CM | POA: Diagnosis present

## 2020-07-20 DIAGNOSIS — K219 Gastro-esophageal reflux disease without esophagitis: Secondary | ICD-10-CM | POA: Diagnosis present

## 2020-07-20 DIAGNOSIS — E11319 Type 2 diabetes mellitus with unspecified diabetic retinopathy without macular edema: Secondary | ICD-10-CM | POA: Diagnosis present

## 2020-07-20 DIAGNOSIS — Z20822 Contact with and (suspected) exposure to covid-19: Secondary | ICD-10-CM | POA: Diagnosis present

## 2020-07-20 DIAGNOSIS — E1165 Type 2 diabetes mellitus with hyperglycemia: Secondary | ICD-10-CM

## 2020-07-20 DIAGNOSIS — G4700 Insomnia, unspecified: Secondary | ICD-10-CM | POA: Diagnosis present

## 2020-07-20 DIAGNOSIS — R001 Bradycardia, unspecified: Secondary | ICD-10-CM | POA: Diagnosis present

## 2020-07-20 DIAGNOSIS — D509 Iron deficiency anemia, unspecified: Secondary | ICD-10-CM | POA: Diagnosis not present

## 2020-07-20 DIAGNOSIS — I10 Essential (primary) hypertension: Secondary | ICD-10-CM | POA: Diagnosis present

## 2020-07-20 DIAGNOSIS — E1142 Type 2 diabetes mellitus with diabetic polyneuropathy: Secondary | ICD-10-CM | POA: Diagnosis present

## 2020-07-20 DIAGNOSIS — E119 Type 2 diabetes mellitus without complications: Secondary | ICD-10-CM

## 2020-07-20 LAB — BASIC METABOLIC PANEL
Anion gap: 10 (ref 5–15)
BUN: 24 mg/dL — ABNORMAL HIGH (ref 8–23)
CO2: 27 mmol/L (ref 22–32)
Calcium: 9.5 mg/dL (ref 8.9–10.3)
Chloride: 99 mmol/L (ref 98–111)
Creatinine, Ser: 1.1 mg/dL — ABNORMAL HIGH (ref 0.44–1.00)
GFR, Estimated: 51 mL/min — ABNORMAL LOW (ref >60)
Glucose, Bld: 173 mg/dL — ABNORMAL HIGH (ref 70–99)
Potassium: 4.7 mmol/L (ref 3.5–5.1)
Sodium: 136 mmol/L (ref 135–145)

## 2020-07-20 LAB — CBC
HCT: 38.8 % (ref 36.0–46.0)
Hemoglobin: 12.7 g/dL (ref 12.0–15.0)
MCH: 29.5 pg (ref 26.0–34.0)
MCHC: 32.7 g/dL (ref 30.0–36.0)
MCV: 90.2 fL (ref 80.0–100.0)
Platelets: 212 10*3/uL (ref 150–400)
RBC: 4.3 MIL/uL (ref 3.87–5.11)
RDW: 13.7 % (ref 11.5–15.5)
WBC: 6.4 10*3/uL (ref 4.0–10.5)
nRBC: 0 % (ref 0.0–0.2)

## 2020-07-20 LAB — TROPONIN I (HIGH SENSITIVITY)
Troponin I (High Sensitivity): 7 ng/L (ref <18)
Troponin I (High Sensitivity): 7 ng/L (ref <18)

## 2020-07-20 LAB — RESPIRATORY PANEL BY RT PCR (FLU A&B, COVID)
Influenza A by PCR: NEGATIVE
Influenza B by PCR: NEGATIVE
SARS Coronavirus 2 by RT PCR: NEGATIVE

## 2020-07-20 MED ORDER — ACETAMINOPHEN 325 MG PO TABS: 650.0000 mg | ORAL_TABLET | Freq: Four times a day (QID) | ORAL | Status: DC | PRN

## 2020-07-20 MED ORDER — ASPIRIN EC 81 MG PO TBEC
81.0000 mg | DELAYED_RELEASE_TABLET | Freq: Every day | ORAL | Status: DC
  Administered 2020-07-21 – 2020-07-25 (×5): 81 mg via ORAL
  Filled 2020-07-20 (×5): qty 1

## 2020-07-20 MED ORDER — ENOXAPARIN SODIUM 40 MG/0.4ML ~~LOC~~ SOLN
40.0000 mg | SUBCUTANEOUS | Status: DC
  Administered 2020-07-21 – 2020-07-25 (×5): 40 mg via SUBCUTANEOUS
  Filled 2020-07-20 (×5): qty 0.4

## 2020-07-20 MED ORDER — INSULIN ASPART 100 UNIT/ML ~~LOC~~ SOLN
0.0000 [IU] | Freq: Three times a day (TID) | SUBCUTANEOUS | Status: DC
  Administered 2020-07-21: 2 [IU] via SUBCUTANEOUS
  Administered 2020-07-21 – 2020-07-22 (×2): 3 [IU] via SUBCUTANEOUS
  Administered 2020-07-22 (×2): 2 [IU] via SUBCUTANEOUS
  Administered 2020-07-23: 3 [IU] via SUBCUTANEOUS
  Administered 2020-07-23: 7 [IU] via SUBCUTANEOUS
  Administered 2020-07-23 – 2020-07-24 (×3): 3 [IU] via SUBCUTANEOUS
  Administered 2020-07-25: 9 [IU] via SUBCUTANEOUS
  Administered 2020-07-25: 2 [IU] via SUBCUTANEOUS
  Filled 2020-07-20 (×12): qty 1

## 2020-07-20 MED ORDER — ACETAMINOPHEN 650 MG RE SUPP: 650.0000 mg | Freq: Four times a day (QID) | RECTAL | Status: DC | PRN

## 2020-07-20 MED ORDER — PANTOPRAZOLE SODIUM 40 MG PO TBEC
40.0000 mg | DELAYED_RELEASE_TABLET | Freq: Every day | ORAL | Status: DC
  Administered 2020-07-21 – 2020-07-25 (×5): 40 mg via ORAL
  Filled 2020-07-20 (×5): qty 1

## 2020-07-20 NOTE — H&P (Signed)
History and Physical    PLEASE NOTE THAT DRAGON DICTATION SOFTWARE WAS USED IN THE CONSTRUCTION OF THIS NOTE.   Gabrielle CrewsBarbara Evans Evans JXB:[id number] DOB: 26-Aug-1941 DOA: 07/20/2020  PCP: Gabrielle Evans, Gabrielle Evans, Gabrielle Evans Patient coming from: home   I have personally briefly reviewed patient's old medical records [facility]  Chief Complaint: Shortness of breath  HPI: Gabrielle Evans is a 79 y.o. female with medical history significant for type 2 diabetes mellitus, essential hypertension, chronic iron deficiency anemia, GERD, who is admitted to [facility] on 07/20/2020 with symptomatic bradycardia after presenting from home to [facility] complaining of shortness of breath.   The patient reports approximately 2 weeks of intermittent episodes of shortness of breath that are associated with very mild crampy substernal burning as well as presyncope in the absence of overt syncope.  She has independently noted the episodes occur when she is attempting to bear down move her bowels, although a few of the episodes have occurred in the absence of that type of scenario.  The symptoms lasted variable sometimes only about 30 seconds sometimes for 30+ minutes.  No exacerbation of the symptoms with exertion.  The symptoms are also nonpositional in orientation.  She is checked her both manually and with a blood pressure cuff on several occasions when she is experiencing the above symptoms, and is noted her heart rate to be in the high 30s or low 40s at that time in the absence of any corresponding hypotensive readings.  She denies any recent changes to outpatient medication regimen, including no medication changes over the 2 to 4 weeks leading up to the first onset of the above symptoms.  She specifically denies taking any known AV nodal blocking agents, including no beta-blockers or nondihydropyridine calcium channel blockers or digoxin.  Outside of the last few weeks, the  patient has never previously experienced the above symptoms.  She denies any recent trauma.  Denies any recent fever, chills, rigors, or generalized myalgias.  Denies rhinitis, rhinorrhea, sore throat, cough, abdominal pain, diarrhea, or rash.  No recent traveling or known COVID-19 exposures.  Denies any recent dysuria, gross hematuria, or change in urinary urgency/frequency.  She is not experiencing palpitations with the above episodes.   In the setting of the above symptoms, the patient was [facility] with Gabrielle IvanoffAnna Drane, PA on 07/12/2020.  At that time arrangements were made for a 72-hour Holter monitor, which the patient completed, return, and is awaiting the results thereof.  Additionally, an order was placed at that time for outpatient echocardiogram to further evaluate any contributory structural cardiac abnormalities.  His echocardiogram is scheduled sometime in November.     ED Course:  Vital signs in the ED were notable for the following: Temperature max 98.4; heart rate 41-61; blood pressure 154/67 186/42; respiratory rate 13-16; oxygen saturation 96 to 98% on room air.  Labs were notable for the following: BMP notable for the following: Sodium 136, potassium 4.7, bicarbonate 27, BUN 24, creatinine 1.10.  Serial high-sensitivity troponin I x3 were obtained and nonelevated x3, and trending down from 8 to 7 to 7.  CBC notable for white blood cell count of 6400.  EKG ordered, result really pending. the [facility], Dr. Juliann Evans, was notified this evening of the request for formal cardiology consult, and is anticipated that cardiology will evaluate the patient in person.     Review of Systems: As per HPI otherwise 10 point review  of systems negative.   Past Medical History:  Diagnosis Date  . Anemia   . Aortic stenosis, mild   . Barrett's esophagus   . Diabetes mellitus without complication (HCC)   . Diabetic retinopathy (HCC)   .  Hypertension   . Hyperthyroidism   . Irritable bowel syndrome   . Ischemic colitis (HCC)   . Obesity     Past Surgical History:  Procedure Laterality Date  . CARDIAC CATHETERIZATION    . CHOLECYSTECTOMY    . COLONOSCOPY WITH PROPOFOL N/A 06/30/2015   Procedure: COLONOSCOPY WITH PROPOFOL;  Surgeon: Gabrielle Evans, Gabrielle Evans;  Location: [facility] ENDOSCOPY;  Service: Endoscopy;  Laterality: N/A;  . ESOPHAGOGASTRODUODENOSCOPY (EGD) WITH PROPOFOL N/A 06/30/2015   Procedure: ESOPHAGOGASTRODUODENOSCOPY (EGD) WITH PROPOFOL;  Surgeon: Gabrielle Evans, Gabrielle Evans;  Location: [facility] ENDOSCOPY;  Service: Endoscopy;  Laterality: N/A;  . EYE SURGERY    . TONSILLECTOMY      Social History:  reports that she quit smoking about 36 years ago. She has never used smokeless tobacco. She reports current alcohol use. She reports that she does not use drugs.   Allergies  Allergen Reactions  . Benazepril   . Benicar [Olmesartan] Shortness Of Breath  . Coreg [Carvedilol]   . Cozaar [Losartan]   . Hydralazine Other (See Comments)  . Lopressor [Metoprolol Tartrate]   . Micardis [Telmisartan]   . Norvasc [Amlodipine]   . Tiazac [Diltiazem]   . Toprol Xl [Metoprolol]     Family History  Problem Relation Age of Onset  . Pancreatic cancer Father   . Diabetes Father   . Heart attack Father   . Deep vein thrombosis Mother   . Pulmonary embolism Mother   . Leukemia Maternal Aunt   . Obesity Brother   . Heart disease Brother   . Kidney cancer Neg Hx   . Prostate cancer Neg Hx   . Kidney disease Neg Hx   . Sickle cell trait Neg Hx   . Tuberculosis Neg Hx     Prior to Admission medications   Medication Sig Start Date End Date Taking? Authorizing Provider  aspirin EC 81 MG tablet Take 81 mg by mouth daily.    Provider, Historical, Gabrielle Evans  azelastine (ASTELIN) 0.1 % nasal spray Place 1 spray into both nostrils 2 (two) times daily. Use in each nostril as directed    Provider, Historical, Gabrielle Evans  Calcium-Vitamin D-Vitamin Evans  (VIACTIV) 500-500-40 MG-UNT-MCG CHEW Chew 1 tablet by mouth daily.    Provider, Historical, Gabrielle Evans  DOCUSATE CALCIUM PO Take 3 tablets by mouth at bedtime as needed.    Provider, Historical, Gabrielle Evans  felodipine (PLENDIL) 10 MG 24 hr tablet  02/20/19   Provider, Historical, Gabrielle Evans  ferrous sulfate 325 (65 FE) MG tablet Take 325 mg by mouth daily with breakfast.    Provider, Historical, Gabrielle Evans  furosemide (LASIX) 20 MG tablet Take 20 mg by mouth daily. As needed for edema    Provider, Historical, Gabrielle Evans  glucose blood test strip Use 3 (three) times daily 06/15/18   Provider, Historical, Gabrielle Evans  hydrALAZINE (APRESOLINE) 25 MG tablet Take 25 mg by mouth daily.    Provider, Historical, Gabrielle Evans  hypromellose (GENTEAL) 0.3 % GEL ophthalmic ointment Apply to eye.    Provider, Historical, Gabrielle Evans  irbesartan (AVAPRO) 150 MG tablet Take 150 mg by mouth daily.    Provider, Historical, Gabrielle Evans  latanoprost (XALATAN) 0.005 % ophthalmic solution  02/20/19   Provider, Historical, Gabrielle Evans  LEVEMIR 100 UNIT/ML injection  12/28/18  Provider, Historical, Gabrielle Evans  metFORMIN (GLUCOPHAGE) 1000 MG tablet  01/23/19   Provider, Historical, Gabrielle Evans  Multiple Vitamin (MULTIVITAMIN) tablet Take 1 tablet by mouth daily.    Provider, Historical, Gabrielle Evans  OMEGA-3 FATTY ACIDS PO Take by mouth.    Provider, Historical, Gabrielle Evans  omeprazole (PRILOSEC) 20 MG capsule  02/03/19   Provider, Historical, Gabrielle Evans  Polyethyl Glycol-Propyl Glycol (SYSTANE) 0.4-0.3 % GEL ophthalmic gel Place 1 application into both eyes.    Provider, Historical, Gabrielle Evans  pregabalin (LYRICA) 75 MG capsule  03/05/19   Provider, Historical, Gabrielle Evans  sodium chloride (MURO 128) 5 % ophthalmic solution Place 1 drop into both eyes as needed for irritation.    Provider, Historical, Gabrielle Evans     Objective    Physical Exam: Vitals:   07/20/20 1356 07/20/20 1630 07/20/20 1800 07/20/20 1830  BP:  (!) 186/42 (!) 176/54 (!) 187/52  Pulse:  61 (!) 41 (!) 41  Resp:  16 13 (!) 28  Temp:  98.4 F (36.9 C)    TempSrc:  Oral    SpO2:  97% 98% 95%  Weight:  93 kg     Height: 5\' 1"  (1.549 m)       General: appears to be stated age; alert, oriented Skin: warm, dry, no rash Head:  AT/[place] Mouth:  Oral mucosa membranes appear dry, normal dentition Neck: supple; trachea midline Heart: Bradycardic, regular; did not appreciate any M/R/G Lungs: CTAB, did not appreciate any wheezes, rales, or rhonchi Abdomen: + BS; soft, ND, NT Vascular: 2+ pedal pulses b/l; 2+ radial pulses b/l Extremities: no peripheral edema, no muscle wasting Neuro: strength and sensation intact in upper and lower extremities b/l    Labs on Admission: I have personally reviewed following labs and imaging studies  CBC: Recent Labs  Lab 07/20/20 1349  WBC 6.4  HGB 12.7  HCT 38.8  MCV 90.2  PLT 212   Basic Metabolic Panel: Recent Labs  Lab 07/20/20 1349  NA 136  Evans 4.7  CL 99  CO2 27  GLUCOSE 173*  BUN 24*  CREATININE 1.10*  CALCIUM 9.5   GFR: Estimated Creatinine Clearance: 43.1 mL/min (A) (by C-G formula based on SCr of 1.1 mg/dL (H)). Liver Function Tests: No results for input(s): AST, ALT, ALKPHOS, BILITOT, PROT, ALBUMIN in the last 168 hours. No results for input(s): LIPASE, AMYLASE in the last 168 hours. No results for input(s): AMMONIA in the last 168 hours. Coagulation Profile: No results for input(s): INR, PROTIME in the last 168 hours. Cardiac Enzymes: No results for input(s): CKTOTAL, CKMB, CKMBINDEX, TROPONINI in the last 168 hours. BNP (last 3 results) No results for input(s): PROBNP in the last 8760 hours. HbA1C: No results for input(s): HGBA1C in the last 72 hours. CBG: No results for input(s): GLUCAP in the last 168 hours. Lipid Profile: No results for input(s): CHOL, HDL, LDLCALC, TRIG, CHOLHDL, LDLDIRECT in the last 72 hours. Thyroid Function Tests: No results for input(s): TSH, T4TOTAL, FREET4, T3FREE, THYROIDAB in the last 72 hours. Anemia Panel: No results for input(s): VITAMINB12, FOLATE, FERRITIN, TIBC, IRON, RETICCTPCT in the  last 72 hours. Urine analysis:    Component Value Date/Time   APPEARANCEUR Cloudy (A) 08/29/2017 1533   GLUCOSEU Negative 08/29/2017 1533   BILIRUBINUR Negative 08/29/2017 1533   PROTEINUR Negative 08/29/2017 1533   NITRITE Negative 08/29/2017 1533   LEUKOCYTESUR Trace (A) 08/29/2017 1533    Radiological Exams on Admission: DG Chest 2 View  Result Date: 07/20/2020 CLINICAL DATA:  Shortness of breath.  EXAM: CHEST - 2 VIEW COMPARISON:  March 23, 2019 FINDINGS: The heart size and mediastinal contours are within normal limits. Aortic atherosclerosis. No consolidation. No visible pleural effusions or pneumothorax. The visualized skeletal structures are unremarkable. IMPRESSION: No active cardiopulmonary disease. Electronically Signed   By: Feliberto Harts Gabrielle Evans   On: 07/20/2020 14:30     Assessment/Plan   Gabrielle Evans is a 79 y.o. female with medical history significant for type 2 diabetes mellitus, essential hypertension, chronic iron deficiency anemia, GERD, who is admitted to [facility] on 07/20/2020 with symptomatic bradycardia after presenting from home to [facility] Emergency Department complaining of shortness of breath.     Principal Problem:   Bradycardia Active Problems:   Anemia   Hypertension   Type 2 diabetes mellitus (HCC)   GERD (gastroesophageal reflux disease)    #) Symptomatic bradycardia: The patient reports approximately 2 weeks of intermittent episodes of shortness of breath associated substernal burning and a presyncopal sensation that appears to be associated with bradycardia in the absence of any corresponding hypotension.  Frequently the episodes appear to be brought on the unintentional vagal maneuvers at the time of patient's bowel movements.  I suspect that these vagal maneuvers are not the primary factor leading to her symptomatic bradycardia, but because further exacerbation of a more primary etiology, including conduction  abnormalities of the large itself.  Per chart review, patient's past medical history also includes hyperthyroidism.  Considered the possibility of heightened vagal tone associated with the thyroid anatomy versus implications of any prior radiation or surgical intervention.  However, the patient reports that she was never diagnosed with subclinical hyperthyroidism, has never undergone any form of radiation for these previous results.  Regardless, will check TSH.  Presenting labs demonstrate no evidence of hyperkalemia.  We will add on ionized calcium to evaluate for any associated hypercalcemia.  On-call cardiologist from [place], Dr. Juliann Pares, formally consulted, with ensuing recommendations pending.  I will order an echocardiogram to occur in the morning to evaluate any structural contributions.  Overall, the patient demonstrated any evidence of hemodynamic instability, as she has remained normotensive to mildly hypertensive, even when bradycardic.  Therefore, there does not appear to be any indication for emergent overnight temporary pacemaker placement or transcutaneous pacing at this time.  No evidence of ACS, evaluation of EKG as well as serial troponin found to be nonelevated trending down.  Plan: Monitor on symmetry.  Check TSH.  Echocardiogram in the morning.  Formal cardiology consult, as above.  Check repeat CMP in the morning.  I have also placed a inpatient pharmacy consult to request further review of her outpatient medication list including for any recently discontinued her modified AV nodal blocking agents.      #) Type 2 diabetes mellitus: On Levemir as an outpatient, in the absence of any short acting insulin.  She is also on Metformin.  Presenting blood sugar per presenting BMP noted to be 173.  We will hold home basal insulin for now.  I have ordered Accu-Cheks before every meal and at bedtime with associated moderate dose sliding scale insulin.  Check hemoglobin A1c.     #) chronic  iron deficiency anemia: Presenting hemoglobin found to be 12.7 which appears consistent with hemoglobin range.  No evidence of active bleeding.  The patient is on daily oral iron supplementation at home.  Plan: Repeat CBC in the morning.  We will plan to resume oral iron supplementation at discharge.     #)  GERD: On omeprazole as an outpatient.  Plan: Continue on PPI.     #) Hypertension: There appears to be some inconsistencies with the current outpatient medication list that I have available.  As the patient does not remember the names of all her, I have placed a inpatient pharmacy consult for their assistance with reconciliation of her true outpatient antihypertensive regimen.  Plan: Monitor for result patient pharmacy medication reconciliation.  Close monitoring of subsequent blood pressures via routine vital signs.     DVT prophylaxis: Lovenox 40 mg subcu daily Code Status: Full code Family Communication: none Disposition Plan: Per Rounding Team Consults called: Dr. Juliann Pares notified of formal consultation request, as above. Admission status: Inpatient; PCU.    PLEASE NOTE THAT DRAGON DICTATION SOFTWARE WAS USED IN THE CONSTRUCTION OF THIS NOTE.   Angie Fava DO [facility] Pager [phone] From 12PM- 12AM  Otherwise, please contact night-coverage  [email or web address] Password [facility]  07/20/2020, 7:28 PM

## 2020-07-20 NOTE — ED Notes (Signed)
Pt resting in bed no needs at this time.

## 2020-07-20 NOTE — ED Provider Notes (Signed)
[facility] Emergency Department Provider Note ____________________________________________   First MD Initiated Contact with Patient 07/20/20 1732     (approximate)  I have reviewed the triage vital signs and the nursing notes.  HISTORY  Chief Complaint Shortness of Breath   HPI Gabrielle Evans is a 79 y.o. femalewho presents to the ED for evaluation of shortness of breath.  Chart review indicates HTN, DM on insulin, obesity, IBS and hyperthyroidism. Patient takes felodipine as part of her antihypertensive regimen, no other AV nodal blocking agents. Patient saw cardiology 8 days ago as an outpatient for an acute visit for concerns for bradycardia, chest discomfort and shortness of breath.  Patient was ordered a 72-hour Holter monitor and repeat echocardiogram.   Patient reports wearing his Holter monitor and turning it and without hearing the results yet.  She reports having outpatient scheduled echocardiogram in 2 weeks.  Patient reports continued episodes of intermittent symptomatic bradycardia.  She reports it often happens prior to needing to pass a bowel movement, but indicates multiple episodes of the past 2 weeks where she will feel sudden onset shortness of breath and presyncopal dizziness without syncope.  She will check her pulse manually or with a BP cuff and it is often in the 30s or 40s while she is symptomatic.  She reports a similar episode occurring yesterday, her passing a bowel movement and her symptoms have been persistent since then, for about 24 hours.  She denies any syncopal episodes, but reports difficulty performing her ADLs due to how she feels.  Past Medical History:  Diagnosis Date  . Anemia   . Aortic stenosis, mild   . Barrett's esophagus   . Diabetes mellitus without complication (HCC)   . Diabetic retinopathy (HCC)   . Hypertension   . Hyperthyroidism   . Irritable bowel syndrome   . Ischemic colitis (HCC)   . Obesity      Patient Active Problem List   Diagnosis Date Noted  . Bradycardia 07/20/2020  . 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 CARDIAC CATHETERIZATION    . CHOLECYSTECTOMY    . COLONOSCOPY WITH PROPOFOL N/A 06/30/2015   Procedure: COLONOSCOPY WITH PROPOFOL;  Surgeon: Scot Jun, MD;  Location: [facility] ENDOSCOPY;  Service: Endoscopy;  Laterality: N/A;  . ESOPHAGOGASTRODUODENOSCOPY (EGD) WITH PROPOFOL N/A 06/30/2015   Procedure: ESOPHAGOGASTRODUODENOSCOPY (EGD) WITH PROPOFOL;  Surgeon: Scot Jun, MD;  Location: [facility] ENDOSCOPY;  Service: Endoscopy;  Laterality: N/A;  . EYE SURGERY    . TONSILLECTOMY      Prior to Admission medications   Medication Sig Start Date End Date Taking? Authorizing Provider  aspirin EC 81 MG tablet Take 81 mg by mouth daily.    [provider]  azelastine (ASTELIN) 0.1 % nasal spray Place 1 spray into both nostrils 2 (two) times daily. Use in each nostril as directed    [provider]  Calcium-Vitamin D-Vitamin K (VIACTIV) 500-500-40 MG-UNT-MCG CHEW Chew 1 tablet by mouth daily.    [provider]  DOCUSATE CALCIUM PO Take 3 tablets by mouth at bedtime as needed.    [provider]  felodipine (PLENDIL) 10 MG 24 hr tablet  02/20/19   [provider]  ferrous sulfate 325 (65 FE) MG tablet Take 325 mg by mouth daily with breakfast.    [provider]  furosemide (LASIX) 20 MG tablet Take 20 mg by mouth daily. As needed  for edema    [provider]  glucose blood test strip Use 3 (three) times daily 06/15/18   [provider]  hydrALAZINE (APRESOLINE) 25 MG tablet Take 25 mg by mouth daily.    [provider]  hypromellose (GENTEAL) 0.3 % GEL ophthalmic ointment Apply to eye.    [provider]  irbesartan (AVAPRO) 150 MG tablet Take 150 mg by mouth daily.    [provider]   latanoprost (XALATAN) 0.005 % ophthalmic solution  02/20/19   [provider]  LEVEMIR 100 UNIT/ML injection  12/28/18   [provider]  metFORMIN (GLUCOPHAGE) 1000 MG tablet  01/23/19   [provider]  Multiple Vitamin (MULTIVITAMIN) tablet Take 1 tablet by mouth daily.    [provider]  OMEGA-3 FATTY ACIDS PO Take by mouth.    [provider]  omeprazole (PRILOSEC) 20 MG capsule  02/03/19   [provider]  Polyethyl Glycol-Propyl Glycol (SYSTANE) 0.4-0.3 % GEL ophthalmic gel Place 1 application into both eyes.    [provider]  pregabalin (LYRICA) 75 MG capsule  03/05/19   [provider]  sodium chloride (MURO 128) 5 % ophthalmic solution Place 1 drop into both eyes as needed for irritation.    [provider]    Allergies Benazepril, Benicar [olmesartan], Coreg [carvedilol], Cozaar [losartan], Hydralazine, Lopressor [metoprolol tartrate], Micardis [telmisartan], Norvasc [amlodipine], Tiazac [diltiazem], and Toprol xl [metoprolol]  Family History  Problem Relation Age of Onset  . Pancreatic cancer Father   . Diabetes Father   . Heart attack Father   . Deep vein thrombosis Mother   . Pulmonary embolism Mother   . Leukemia Maternal Aunt   . Obesity Brother   . Heart disease Brother   . Kidney cancer Neg Hx   . Prostate cancer Neg Hx   . Kidney disease Neg Hx   . Sickle cell trait Neg Hx   . Tuberculosis Neg Hx     Social History Social History   Tobacco Use  . Smoking status: Former Smoker    Quit date: 08/30/1983    Years since quitting: 36.9  . Smokeless tobacco: Never Used  Vaping Use  . Vaping Use: Never used  Substance Use Topics  . Alcohol use: Yes    Comment: Occasional  . Drug use: No    Review of Systems  Constitutional: No fever/chills Eyes: No visual changes. ENT: No sore throat. Cardiovascular: Denies chest pain. Respiratory: Positive for shortness of  breath. Gastrointestinal: No abdominal pain.  No nausea, no vomiting.  No diarrhea.  No constipation. Genitourinary: Negative for dysuria. Musculoskeletal: Negative for back pain. Skin: Negative for rash. Neurological: Negative for headaches, focal weakness or numbness.  Positive for presyncopal dizziness.  ____________________________________________   PHYSICAL EXAM:  VITAL SIGNS: Vitals:   07/20/20 1800 07/20/20 1830  BP: (!) 176/54 (!) 187/52  Pulse: (!) 41 (!) 41  Resp: 13 (!) 28  Temp:    SpO2: 98% 95%     Constitutional: Alert and oriented. Well appearing and in no acute distress. Eyes: Conjunctivae are normal. PERRL. EOMI. Head: Atraumatic. Nose: No congestion/rhinnorhea. Mouth/Throat: Mucous membranes are moist.  Oropharynx non-erythematous. Neck: No stridor. No cervical spine tenderness to palpation. Cardiovascular: Bradycardic rate, regular rhythm. Grossly normal heart sounds.  Good peripheral circulation. Respiratory: Normal respiratory effort.  No retractions. Lungs CTAB. Gastrointestinal: Soft , nondistended, nontender to palpation. No abdominal bruits. No CVA tenderness. Musculoskeletal: No lower extremity tenderness nor edema.  No joint  effusions. No signs of acute trauma. Neurologic:  Normal speech and language. No gross focal neurologic deficits are appreciated. No gait instability noted. Skin:  Skin is warm, dry and intact. No rash noted. Psychiatric: Mood and affect are normal. Speech and behavior are normal.  ____________________________________________   LABS (all labs ordered are listed, but only abnormal results are displayed)  Labs Reviewed  BASIC METABOLIC PANEL - Abnormal; Notable for the following components:      Result Value   Glucose, Bld 173 (*)    BUN 24 (*)    Creatinine, Ser 1.10 (*)    GFR, Estimated 51 (*)    All other components within normal limits  RESPIRATORY PANEL BY RT PCR (FLU A&B, COVID)  CBC  TROPONIN I (HIGH SENSITIVITY)   TROPONIN I (HIGH SENSITIVITY)   ____________________________________________  12 Lead EKG  Sinus rhythm, rate of 42 bpm.  Left axis.  Normal intervals.  No evidence of acute ischemia. ____________________________________________  RADIOLOGY  ED MD interpretation: 2 view CXR without evidence of cardiopulmonary pathology.  Official radiology report(s): DG Chest 2 View  Result Date: 07/20/2020 CLINICAL DATA:  Shortness of breath. EXAM: CHEST - 2 VIEW COMPARISON:  March 23, 2019 FINDINGS: The heart size and mediastinal contours are within normal limits. Aortic atherosclerosis. No consolidation. No visible pleural effusions or pneumothorax. The visualized skeletal structures are unremarkable. IMPRESSION: No active cardiopulmonary disease. Electronically Signed   By: Feliberto Harts MD   On: 07/20/2020 14:30    ____________________________________________   PROCEDURES and INTERVENTIONS  Procedure(s) performed (including Critical Care):  .1-3 Lead EKG Interpretation Performed by: Delton Prairie, MD Authorized by: Delton Prairie, MD     Interpretation: abnormal     ECG rate:  44   ECG rate assessment: bradycardic     Rhythm: sinus bradycardia     Ectopy: none     Conduction: normal      Medications - No data to display  ____________________________________________   MDM / ED COURSE  79 year old woman presents to the ED with symptomatic pericardia requiring medical admission.  Patient with heart rate in the low 40s, otherwise normal vital signs on room air.  Exam is quite reassuring with a well-appearing patient without distress or significant derangements beyond her bradycardia.  Work-up shows normal electrolytes and no evidence of acute ischemia or ACS.  EKG demonstrates a sinus bradycardia without ischemic or interval changes.  I am uncertain if this is a medication side effect from her felodipine or a remarkably strong vagal response as this is often related to her bowel  movements and intra-abdominal pressure.  Regardless, due to the symptomatic nature we will keep the patient on the monitor and admit to hospitalist medicine for further work-up and management.  Clinical Course as of Jul 20 1949  Thu Jul 20, 2020  1924 Spoke with admitting hospitalist who requested that I consult cardiology.  Dr. Juliann Pares paged.   [DS]    Clinical Course User Index [DS] Delton Prairie, MD     ____________________________________________   FINAL CLINICAL IMPRESSION(S) / ED DIAGNOSES  Final diagnoses:  Symptomatic bradycardia  Sinus bradycardia  Primary hypertension     ED Discharge Orders    None       Vail Vuncannon Katrinka Blazing   Note:  This document was prepared using Dragon voice recognition software and may include unintentional dictation errors.   Delton Prairie, MD 07/20/20 2140

## 2020-07-20 NOTE — ED Triage Notes (Signed)
Patient c/o sob, recent chest pressure, and low pulse rate episodes X1 month. Reports an episode last week and now again today. A&O x4 in triage. Unlabored respirations noted in triage. Able to speak in complete sentences with no problems

## 2020-07-21 ENCOUNTER — Inpatient Hospital Stay
Admit: 2020-07-21 | Discharge: 2020-07-21 | Disposition: A | Discharge status: Home / Self Care | Payer: Medicare PPO | Attending: Internal Medicine | Admitting: Internal Medicine

## 2020-07-21 ENCOUNTER — Encounter: Payer: Self-pay | Admitting: Internal Medicine

## 2020-07-21 LAB — COMPREHENSIVE METABOLIC PANEL
ALT: 34 U/L (ref 0–44)
AST: 30 U/L (ref 15–41)
Albumin: 3.5 g/dL (ref 3.5–5.0)
Alkaline Phosphatase: 74 U/L (ref 38–126)
Anion gap: 9 (ref 5–15)
BUN: 22 mg/dL (ref 8–23)
CO2: 27 mmol/L (ref 22–32)
Calcium: 9.2 mg/dL (ref 8.9–10.3)
Chloride: 103 mmol/L (ref 98–111)
Creatinine, Ser: 0.89 mg/dL (ref 0.44–1.00)
GFR, Estimated: >60 mL/min (ref >60)
Glucose, Bld: 164 mg/dL — ABNORMAL HIGH (ref 70–99)
Potassium: 4.3 mmol/L (ref 3.5–5.1)
Sodium: 139 mmol/L (ref 135–145)
Total Bilirubin: 0.8 mg/dL (ref 0.3–1.2)
Total Protein: 6.2 g/dL — ABNORMAL LOW (ref 6.5–8.1)

## 2020-07-21 LAB — GLUCOSE, CAPILLARY
Glucose-Capillary: 151 mg/dL — ABNORMAL HIGH (ref 70–99)
Glucose-Capillary: 207 mg/dL — ABNORMAL HIGH (ref 70–99)
Glucose-Capillary: 166 mg/dL — ABNORMAL HIGH (ref 70–99)
Glucose-Capillary: 194 mg/dL — ABNORMAL HIGH (ref 70–99)

## 2020-07-21 LAB — CBC
HCT: 35.4 % — ABNORMAL LOW (ref 36.0–46.0)
Hemoglobin: 11.8 g/dL — ABNORMAL LOW (ref 12.0–15.0)
MCH: 30.1 pg (ref 26.0–34.0)
MCHC: 33.3 g/dL (ref 30.0–36.0)
MCV: 90.3 fL (ref 80.0–100.0)
Platelets: 165 10*3/uL (ref 150–400)
RBC: 3.92 MIL/uL (ref 3.87–5.11)
RDW: 13.8 % (ref 11.5–15.5)
WBC: 5.1 10*3/uL (ref 4.0–10.5)
nRBC: 0 % (ref 0.0–0.2)

## 2020-07-21 LAB — MAGNESIUM: Magnesium: 1.8 mg/dL (ref 1.7–2.4)

## 2020-07-21 LAB — HEMOGLOBIN A1C
Hgb A1c MFr Bld: 7 % — ABNORMAL HIGH (ref 4.8–5.6)
Mean Plasma Glucose: 154 mg/dL

## 2020-07-21 LAB — TSH: TSH: 0.541 u[IU]/mL (ref 0.350–4.500)

## 2020-07-21 MED ORDER — LORAZEPAM 2 MG/ML IJ SOLN
1.0000 mg | Freq: Once | INTRAMUSCULAR | Status: AC
  Administered 2020-07-21: 1 mg via INTRAVENOUS
  Filled 2020-07-21: qty 1

## 2020-07-21 MED ORDER — HYDROCHLOROTHIAZIDE 25 MG PO TABS
25.0000 mg | ORAL_TABLET | Freq: Every day | ORAL | Status: DC
  Administered 2020-07-22 – 2020-07-24 (×3): 25 mg via ORAL
  Filled 2020-07-21 (×4): qty 1

## 2020-07-21 MED ORDER — PREGABALIN 75 MG PO CAPS
75.0000 mg | ORAL_CAPSULE | Freq: Two times a day (BID) | ORAL | Status: DC
  Administered 2020-07-21 – 2020-07-24 (×6): 75 mg via ORAL
  Filled 2020-07-21 (×6): qty 1

## 2020-07-21 MED ORDER — INFLUENZA VAC A&B SA ADJ QUAD 0.5 ML IM PRSY
0.5000 mL | PREFILLED_SYRINGE | INTRAMUSCULAR | Status: DC
  Filled 2020-07-21: qty 0.5

## 2020-07-21 NOTE — ED Notes (Signed)
RN on 2A asking if pt BP will be addressed. Md aware via messaging system.

## 2020-07-21 NOTE — Plan of Care (Signed)

## 2020-07-21 NOTE — Consult Note (Signed)
CARDIOLOGY CONSULT NOTE               Patient ID: Gabrielle Evans MRN: [id number] DOB/AGE: 02-21-1941 79 y.o.  Admit date: 07/20/2020 Referring Physician Dr. Lurene Shadow hospitalist Primary Physician [name] NP Primary Cardiologist Dr. Darrold Junker Reason for Consultation symptomatic bradycardia complete heart block  HPI: Patient is a 79 year old with a history of bradycardia has become symptomatic she is being worked up as an outpatient with symptoms persisted so she came to the emergency room was found to have heart rate in the 40s was found to have high-grade second and third-degree heart block while on telemetry patient is now being set up for permanent pacemaker possibly with micro.  Denies any other medical problems she is right-handed she is not had any blackout spells or syncope no nausea vomiting  Review of systems complete and found to be negative unless listed above     Past Medical History:  Diagnosis Date  . Anemia   . Aortic stenosis, mild   . Barrett's esophagus   . Diabetes mellitus without complication (HCC)   . Diabetic retinopathy (HCC)   . Hypertension   . Hyperthyroidism   . Irritable bowel syndrome   . Ischemic colitis (HCC)   . Obesity     Past Surgical History:  Procedure Laterality Date  . CARDIAC CATHETERIZATION    . CHOLECYSTECTOMY    . COLONOSCOPY WITH PROPOFOL N/A 06/30/2015   Procedure: COLONOSCOPY WITH PROPOFOL;  Surgeon: Scot Jun, MD;  Location: [facility] ENDOSCOPY;  Service: Endoscopy;  Laterality: N/A;  . ESOPHAGOGASTRODUODENOSCOPY (EGD) WITH PROPOFOL N/A 06/30/2015   Procedure: ESOPHAGOGASTRODUODENOSCOPY (EGD) WITH PROPOFOL;  Surgeon: Scot Jun, MD;  Location: [facility] ENDOSCOPY;  Service: Endoscopy;  Laterality: N/A;  . EYE SURGERY    . TONSILLECTOMY      Medications Prior to Admission  Medication Sig Dispense Refill Last Dose  . aspirin EC 81 MG tablet Take 81 mg by mouth 2 (two) times daily.    Past Week at Unknown  time  . Calcium-Vitamin D-Vitamin K (VIACTIV) 500-500-40 MG-UNT-MCG CHEW Chew 1-2 tablets by mouth daily.    Past Week at Unknown time  . felodipine (PLENDIL) 10 MG 24 hr tablet Take 10 mg by mouth daily.    Past Week at Unknown time  . ferrous sulfate 325 (65 FE) MG tablet Take 325 mg by mouth daily.    Past Week at Unknown time  . furosemide (LASIX) 20 MG tablet Take 20 mg by mouth daily as needed for edema.    prn at prn  . hydrochlorothiazide (HYDRODIURIL) 25 MG tablet Take 25 mg by mouth daily.   Past Week at Unknown time  . latanoprost (XALATAN) 0.005 % ophthalmic solution Place 1 drop into both eyes at bedtime.    Past Week at Unknown time  . LEVEMIR 100 UNIT/ML injection Inject 20-80 Units into the skin 2 (two) times daily. Typically 20 units in the morning and 80 units at night   Past Week at Unknown time  . metFORMIN (GLUCOPHAGE) 1000 MG tablet Take 1,000 mg by mouth 2 (two) times daily with a meal.    Past Week at Unknown time  . omeprazole (PRILOSEC) 20 MG capsule Take 20 mg by mouth daily.    Past Week at Unknown time  . pregabalin (LYRICA) 75 MG capsule Take 75 mg by mouth 2 (two) times daily. In mid-afternoon and at bedtime   Past Week at Unknown time  . senna-docusate (SENOKOT-S) 8.6-50  MG tablet Take 2-3 tablets by mouth at bedtime.   Past Week at Unknown time  . sodium chloride (MURO 128) 5 % ophthalmic solution Place 1 drop into both eyes at bedtime.    Past Week at Unknown time  . timolol (TIMOPTIC) 0.5 % ophthalmic solution Place 1 drop into both eyes 2 (two) times daily.   Past Week at Unknown time  . glucose blood test strip 1 each by Other route 3 (three) times daily.       Social History   Socioeconomic History  . Marital status: Married    Spouse name: Not on file  . Number of children: Not on file  . Years of education: Not on file  . Highest education level: Not on file  Occupational History  . Not on file  Tobacco Use  . Smoking status: Former Smoker    Quit  date: 08/30/1983    Years since quitting: 36.9  . Smokeless tobacco: Never Used  Vaping Use  . Vaping Use: Never used  Substance and Sexual Activity  . Alcohol use: Yes    Comment: Occasional  . Drug use: No  . Sexual activity: Not on file  Other Topics Concern  . Not on file  Social History Narrative  . Not on file   Social Determinants of Health   Financial Resource Strain:   . Difficulty of Paying Living Expenses: Not on file  Food Insecurity:   . Worried About Programme researcher, broadcasting/film/video in the Last Year: Not on file  . Ran Out of Food in the Last Year: Not on file  Transportation Needs:   . Lack of Transportation (Medical): Not on file  . Lack of Transportation (Non-Medical): Not on file  Physical Activity:   . Days of Exercise per Week: Not on file  . Minutes of Exercise per Session: Not on file  Stress:   . Feeling of Stress : Not on file  Social Connections:   . Frequency of Communication with Friends and Family: Not on file  . Frequency of Social Gatherings with Friends and Family: Not on file  . Attends Religious Services: Not on file  . Active Member of Clubs or Organizations: Not on file  . Attends Banker Meetings: Not on file  . Marital Status: Not on file  Intimate Partner Violence:   . Fear of Current or Ex-Partner: Not on file  . Emotionally Abused: Not on file  . Physically Abused: Not on file  . Sexually Abused: Not on file    Family History  Problem Relation Age of Onset  . Pancreatic cancer Father   . Diabetes Father   . Heart attack Father   . Deep vein thrombosis Mother   . Pulmonary embolism Mother   . Leukemia Maternal Aunt   . Obesity Brother   . Heart disease Brother   . Kidney cancer Neg Hx   . Prostate cancer Neg Hx   . Kidney disease Neg Hx   . Sickle cell trait Neg Hx   . Tuberculosis Neg Hx       Review of systems complete and found to be negative unless listed above      PHYSICAL EXAM  General: Well developed,  well nourished, in no acute distress HEENT:  Normocephalic and atramatic Neck:  No JVD.  Lungs: Clear bilaterally to auscultation and percussion. Heart: Bradycardic. Normal S1 and S2 without gallops or murmurs.  Abdomen: Bowel sounds are positive, abdomen soft and non-tender  Msk:  Back normal, normal gait. Normal strength and tone for age. Extremities: No clubbing, cyanosis or edema.   Neuro: Alert and oriented X 3. Psych:  Good affect, responds appropriately  Labs:   Lab Results  Component Value Date   WBC 5.1 07/21/2020   HGB 11.8 (L) 07/21/2020   HCT 35.4 (L) 07/21/2020   MCV 90.3 07/21/2020   PLT 165 07/21/2020    Recent Labs  Lab 07/21/20 0503  NA 139  K 4.3  CL 103  CO2 27  BUN 22  CREATININE 0.89  CALCIUM 9.2  PROT 6.2*  BILITOT 0.8  ALKPHOS 74  ALT 34  AST 30  GLUCOSE 164*   No results found for: CKTOTAL, CKMB, CKMBINDEX, TROPONINI No results found for: CHOL No results found for: HDL No results found for: LDLCALC No results found for: TRIG No results found for: CHOLHDL No results found for: LDLDIRECT    Radiology: DG Chest 2 View  Result Date: 07/20/2020 CLINICAL DATA:  Shortness of breath. EXAM: CHEST - 2 VIEW COMPARISON:  March 23, 2019 FINDINGS: The heart size and mediastinal contours are within normal limits. Aortic atherosclerosis. No consolidation. No visible pleural effusions or pneumothorax. The visualized skeletal structures are unremarkable. IMPRESSION: No active cardiopulmonary disease. 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 Plan Agree with Tele Agree with Echo for evaluation Diabetes control with medical therapy Continue blood pressure control Plendil HCTZ Preop for permanent pacemaker on Monday with Dr. Darrold Junker  Signed: Alwyn Pea  MD 07/21/2020, 9:18 AM

## 2020-07-21 NOTE — Progress Notes (Signed)
   07/21/20 0800  Assess: MEWS Score  Temp 97.7 F (36.5 C)  BP (!) 130/50  Pulse Rate (!) 39  Resp 14  Level of Consciousness Alert  SpO2 99 %  O2 Device Room Air  Patient Activity (if Appropriate) In chair  Assess: MEWS Score  MEWS Temp 0  MEWS Systolic 0  MEWS Pulse 2  MEWS RR 0  MEWS LOC 0  MEWS Score 2  MEWS Score Color Yellow  Assess: if the MEWS score is Yellow or Red  Were vital signs taken at a resting state? Yes  Focused Assessment No change from prior assessment  Early Detection of Sepsis Score *See Row Information* Low  MEWS guidelines implemented *See Row Information* Yes  Treat  Pain Scale 0-10  Pain Score 0  Take Vital Signs  Increase Vital Sign Frequency  Yellow: Q 2hr X 2 then Q 4hr X 2, if remains yellow, continue Q 4hrs  Escalate  MEWS: Escalate Yellow: discuss with charge nurse/RN and consider discussing with provider and RRT  Notify: Charge Nurse/RN  Name of Charge Nurse/RN Notified Tammy RN  Date Charge Nurse/RN Notified 07/21/20  Time Charge Nurse/RN Notified 0848  Document  Patient Outcome Other (Comment) (will continue to monitor)  Progress note created (see row info) Yes

## 2020-07-21 NOTE — Progress Notes (Addendum)
Progress Note    Gabrielle Evans  GQB:[id number] DOB: [birth year]  DOA: 07/20/2020 PCP: Patrice Paradise, MD      Brief Narrative:    Medical records reviewed and are as summarized below:  Gabrielle Evans is a 79 y.o. female       Assessment/Plan:   Principal Problem:   Bradycardia Active Problems:   Anemia   Hypertension   Type 2 diabetes mellitus (HCC)   GERD (gastroesophageal reflux disease)    Body mass index is 39.59 kg/m.  (Morbid obesity)  Sinus bradycardia/second-degree type II heart block: 2D echo is pending.  Plan for permanent pacemaker placement on Monday, July 24, 2020.  Follow-up with cardiologist.  Monitor on telemetry  Type II DM: Humalog as needed for hyperglycemia  Hypertension: Resume HCTZ.    Diet Order            Diet regular Room service appropriate? Yes; Fluid consistency: Thin  Diet effective now                    Consultants:  Cardiologist  Procedures:  None    Medications:   . aspirin EC  81 mg Oral Daily  . enoxaparin (LOVENOX) injection  40 mg Subcutaneous Q24H  . [START ON 07/22/2020] influenza vaccine adjuvanted  0.5 mL Intramuscular Tomorrow-1000  . insulin aspart  0-9 Units Subcutaneous TID WC  . pantoprazole  40 mg Oral Daily   Continuous Infusions:   Anti-infectives (From admission, onward)   None             Family Communication/Anticipated D/C date and plan/Code Status   DVT prophylaxis: enoxaparin (LOVENOX) injection 40 mg Start: 07/21/20 0900     Code Status: Full Code  Family Communication: None Disposition Plan:    Status is: Inpatient  Remains inpatient appropriate because:Inpatient level of care appropriate due to severity of illness   Dispo: The patient is from: Home              Anticipated d/c is to: Home              Anticipated d/c date is: > 3 days              Patient currently is not medically stable to d/c.           Subjective:   She  complains of shortness of breath  Objective:    Vitals:   07/21/20 0800 07/21/20 0810 07/21/20 1013 07/21/20 1200  BP: (!) 130/50  132/60 128/78  Pulse: (!) 39 (!) 42 (!) 45   Resp: 14 11 14    Temp: 97.7 F (36.5 C)  97.7 F (36.5 C) (!) 97.4 F (36.3 C)  TempSrc: Oral  Axillary Oral  SpO2: 99%  100% 98%  Weight:      Height:       No data found.   Intake/Output Summary (Last 24 hours) at 07/21/2020 1513 Last data filed at 07/21/2020 1350 Gross per 24 hour  Intake 440 ml  Output 300 ml  Net 140 ml   Filed Weights   07/20/20 1356 07/21/20 0141  Weight: 93 kg 91.9 kg    Exam:  GEN: NAD SKIN: No rash EYES: EOMI ENT: MMM CV: RRR, badycardic PULM: CTA B ABD: soft, ND, NT, +BS CNS: AAO x 3, non focal EXT: Left leg appears bigger than the right leg. She said it's been like that for years. Chronic mild tenderness in  bilateral  legs.   Data Reviewed:   I have personally reviewed following labs and imaging studies:  Labs: Labs show the following:   Basic Metabolic Panel: Recent Labs  Lab 07/20/20 1349 07/21/20 0503  NA 136 139  K 4.7 4.3  CL 99 103  CO2 27 27  GLUCOSE 173* 164*  BUN 24* 22  CREATININE 1.10* 0.89  CALCIUM 9.5 9.2  MG  --  1.8   GFR Estimated Creatinine Clearance: 51.9 mL/min (by C-G formula based on SCr of 0.89 mg/dL). Liver Function Tests: Recent Labs  Lab 07/21/20 0503  AST 30  ALT 34  ALKPHOS 74  BILITOT 0.8  PROT 6.2*  ALBUMIN 3.5   No results for input(s): LIPASE, AMYLASE in the last 168 hours. No results for input(s): AMMONIA in the last 168 hours. Coagulation profile No results for input(s): INR, PROTIME in the last 168 hours.  CBC: Recent Labs  Lab 07/20/20 1349 07/21/20 0503  WBC 6.4 5.1  HGB 12.7 11.8*  HCT 38.8 35.4*  MCV 90.2 90.3  PLT 212 165   Cardiac Enzymes: No results for input(s): CKTOTAL, CKMB, CKMBINDEX, TROPONINI in the last 168 hours. BNP (last 3 results) No results for input(s): PROBNP in  the last 8760 hours. CBG: Recent Labs  Lab 07/21/20 0803 07/21/20 1145  GLUCAP 151* 207*   D-Dimer: No results for input(s): DDIMER in the last 72 hours. Hgb A1c: No results for input(s): HGBA1C in the last 72 hours. Lipid Profile: No results for input(s): CHOL, HDL, LDLCALC, TRIG, CHOLHDL, LDLDIRECT in the last 72 hours. Thyroid function studies: Recent Labs    07/21/20 0503  TSH 0.541   Anemia work up: No results for input(s): VITAMINB12, FOLATE, FERRITIN, TIBC, IRON, RETICCTPCT in the last 72 hours. Sepsis Labs: Recent Labs  Lab 07/20/20 1349 07/21/20 0503  WBC 6.4 5.1    Microbiology Recent Results (from the past 240 hour(s))  Respiratory Panel by RT PCR (Flu A&B, Covid) - Nasopharyngeal Swab     Status: None   Collection Time: 07/20/20  7:05 PM   Specimen: Nasopharyngeal Swab  Result Value Ref Range Status   SARS Coronavirus 2 by RT PCR NEGATIVE NEGATIVE Final    Comment: (NOTE) SARS-CoV-2 target nucleic acids are NOT DETECTED.  The SARS-CoV-2 RNA is generally detectable in upper respiratoy specimens during the acute phase of infection. The lowest concentration of SARS-CoV-2 viral copies this assay can detect is 131 copies/mL. A negative result does not preclude SARS-Cov-2 infection and should not be used as the sole basis for treatment or other patient management decisions. A negative result may occur with  improper specimen collection/handling, submission of specimen other than nasopharyngeal swab, presence of viral mutation(s) within the areas targeted by this assay, and inadequate number of viral copies (<131 copies/mL). A negative result must be combined with clinical observations, patient history, and epidemiological information. The expected result is Negative.  Fact Sheet for Patients:  https://www.moore.com/  Fact Sheet for Healthcare Providers:  https://www.young.biz/  This test is no t yet approved or  cleared by the [place] FDA and  has been authorized for detection and/or diagnosis of SARS-CoV-2 by FDA under an Emergency Use Authorization (EUA). This EUA will remain  in effect (meaning this test can be used) for the duration of the COVID-19 declaration under Section 564(b)(1) of the Act, 21 U.S.C. section 360bbb-3(b)(1), unless the authorization is terminated or revoked sooner.     Influenza A by PCR NEGATIVE NEGATIVE Final   Influenza  B by PCR NEGATIVE NEGATIVE Final    Comment: (NOTE) The Xpert Xpress SARS-CoV-2/FLU/RSV assay is intended as an aid in  the diagnosis of influenza from Nasopharyngeal swab specimens and  should not be used as a sole basis for treatment. Nasal washings and  aspirates are unacceptable for Xpert Xpress SARS-CoV-2/FLU/RSV  testing.  Fact Sheet for Patients: https://www.moore.com/  Fact Sheet for Healthcare Providers: https://www.young.biz/  This test is not yet approved or cleared by the [place] FDA and  has been authorized for detection and/or diagnosis of SARS-CoV-2 by  FDA under an Emergency Use Authorization (EUA). This EUA will remain  in effect (meaning this test can be used) for the duration of the  Covid-19 declaration under Section 564(b)(1) of the Act, 21  U.S.C. section 360bbb-3(b)(1), unless the authorization is  terminated or revoked. Performed at [facility], [address], [place], [place] [address]     Procedures and diagnostic studies:  DG Chest 2 View  Result Date: 07/20/2020 CLINICAL DATA:  Shortness of breath. EXAM: CHEST - 2 VIEW COMPARISON:  March 23, 2019 FINDINGS: The heart size and mediastinal contours are within normal limits. Aortic atherosclerosis. No consolidation. No visible pleural effusions or pneumothorax. The visualized skeletal structures are unremarkable. IMPRESSION: No active cardiopulmonary disease. Electronically Signed   By: Feliberto Harts MD    On: 07/20/2020 14:30               LOS: 1 day   Montoya Watkin  [facility]   Pager on [email or web address]. If 7PM-7AM, please contact night-coverage at [email or web address]     07/21/2020, 3:13 PM

## 2020-07-21 NOTE — Progress Notes (Signed)
*  PRELIMINARY RESULTS* Echocardiogram 2D Echocardiogram has been performed.  Gabrielle Evans 07/21/2020, 2:07 PM

## 2020-07-22 LAB — ECHOCARDIOGRAM COMPLETE
AR max vel: 1.64 cm2
AV Area VTI: 1.82 cm2
AV Area mean vel: 1.56 cm2
AV Mean grad: 7.5 mmHg
AV Peak grad: 15.3 mmHg
Ao pk vel: 1.96 m/s
Area-P 1/2: 4.17 cm2
Height: 60 in
S' Lateral: 3.11 cm
Weight: 3243.2 oz

## 2020-07-22 LAB — GLUCOSE, CAPILLARY
Glucose-Capillary: 206 mg/dL — ABNORMAL HIGH (ref 70–99)
Glucose-Capillary: 211 mg/dL — ABNORMAL HIGH (ref 70–99)
Glucose-Capillary: 168 mg/dL — ABNORMAL HIGH (ref 70–99)
Glucose-Capillary: 255 mg/dL — ABNORMAL HIGH (ref 70–99)

## 2020-07-22 MED ORDER — FELODIPINE ER 10 MG PO TB24
10.0000 mg | ORAL_TABLET | Freq: Every day | ORAL | Status: DC
  Administered 2020-07-22 – 2020-07-25 (×4): 10 mg via ORAL
  Filled 2020-07-22 (×5): qty 1

## 2020-07-22 MED ORDER — LORAZEPAM 1 MG PO TABS
1.0000 mg | ORAL_TABLET | Freq: Every evening | ORAL | Status: DC | PRN
  Administered 2020-07-22 – 2020-07-24 (×3): 1 mg via ORAL
  Filled 2020-07-22 (×3): qty 1

## 2020-07-22 NOTE — Progress Notes (Addendum)
Progress Note    Gabrielle Evans  PNP:[id number] DOB: 04-Aug-1941  DOA: 07/20/2020 PCP: Patrice Paradise, MD      Brief Narrative:    Medical records reviewed and are as summarized below:  Gabrielle Evans is a 79 y.o. female       Assessment/Plan:   Principal Problem:   Bradycardia Active Problems:   Anemia   Hypertension   Type 2 diabetes mellitus (HCC)   GERD (gastroesophageal reflux disease)    Body mass index is 39.18 kg/m.  (Morbid obesity)  Sinus bradycardia/second-degree type II heart block: 2D echo showed EF estimated at 65 to 70% and normal LV diastolic parameters. Plan for permanent pacemaker placement on Monday, July 24, 2020. Follow-up with cardiologist. Telemetry shows heart rate in the upper 30s to low 40s. Continue to monitor on telemetry.   Type II DM: Humalog as needed for hyperglycemia   Hypertension: Continue HCTZ. Resume home dose of amlodipine.    Diet Order            Diet regular Room service appropriate? Yes; Fluid consistency: Thin  Diet effective now                    Consultants:  Cardiologist  Procedures:  None    Medications:   . aspirin EC  81 mg Oral Daily  . enoxaparin (LOVENOX) injection  40 mg Subcutaneous Q24H  . hydrochlorothiazide  25 mg Oral Daily  . influenza vaccine adjuvanted  0.5 mL Intramuscular Tomorrow-1000  . insulin aspart  0-9 Units Subcutaneous TID WC  . pantoprazole  40 mg Oral Daily  . pregabalin  75 mg Oral BID   Continuous Infusions:   Anti-infectives (From admission, onward)   None             Family Communication/Anticipated D/C date and plan/Code Status   DVT prophylaxis: enoxaparin (LOVENOX) injection 40 mg Start: 07/21/20 0900     Code Status: Full Code  Family Communication: None Disposition Plan:    Status is: Inpatient  Remains inpatient appropriate because:Inpatient level of care appropriate due to severity of illness   Dispo: The patient  is from: Home              Anticipated d/c is to: Home              Anticipated d/c date is: 3 days              Patient currently is not medically stable to d/c.           Subjective:   She feels okay at rest but feels a little short of breath and dizzy with activity.  Objective:    Vitals:   07/21/20 1633 07/21/20 1948 07/22/20 0421 07/22/20 0800  BP: 132/66 128/62 118/72 (!) 157/78  Pulse:  (!) 44 67 76  Resp: 17 14 16 18   Temp: 97.6 F (36.4 C) (!) 97.4 F (36.3 C) 98.1 F (36.7 C) (!) 97.5 F (36.4 C)  TempSrc: Oral Oral Oral Oral  SpO2: 97% 96% 99%   Weight:   91 kg   Height:       No data found.   Intake/Output Summary (Last 24 hours) at 07/22/2020 1122 Last data filed at 07/21/2020 1948 Gross per 24 hour  Intake 600 ml  Output 800 ml  Net -200 ml   Filed Weights   07/20/20 1356 07/21/20 0141 07/22/20 0421  Weight: 93 kg 91.9 kg  91 kg    Exam:   GEN: NAD SKIN: No rash EYES: EOMI ENT: MMM CV: RRR, bradycardic PULM: CTA B ABD: soft, ND, NT, +BS CNS: AAO x 3, non focal EXT: No edema. She has chronic tenderness in bilateral legs.     Data Reviewed:   I have personally reviewed following labs and imaging studies:  Labs: Labs show the following:   Basic Metabolic Panel: Recent Labs  Lab 07/20/20 1349 07/21/20 0503  NA 136 139  K 4.7 4.3  CL 99 103  CO2 27 27  GLUCOSE 173* 164*  BUN 24* 22  CREATININE 1.10* 0.89  CALCIUM 9.5 9.2  MG  --  1.8   GFR Estimated Creatinine Clearance: 51.5 mL/min (by C-G formula based on SCr of 0.89 mg/dL). Liver Function Tests: Recent Labs  Lab 07/21/20 0503  AST 30  ALT 34  ALKPHOS 74  BILITOT 0.8  PROT 6.2*  ALBUMIN 3.5   No results for input(s): LIPASE, AMYLASE in the last 168 hours. No results for input(s): AMMONIA in the last 168 hours. Coagulation profile No results for input(s): INR, PROTIME in the last 168 hours.  CBC: Recent Labs  Lab 07/20/20 1349 07/21/20 0503  WBC  6.4 5.1  HGB 12.7 11.8*  HCT 38.8 35.4*  MCV 90.2 90.3  PLT 212 165   Cardiac Enzymes: No results for input(s): CKTOTAL, CKMB, CKMBINDEX, TROPONINI in the last 168 hours. BNP (last 3 results) No results for input(s): PROBNP in the last 8760 hours. CBG: Recent Labs  Lab 07/21/20 0803 07/21/20 1145 07/21/20 1632 07/21/20 2117 07/22/20 0807  GLUCAP 151* 207* 166* 194* 206*   D-Dimer: No results for input(s): DDIMER in the last 72 hours. Hgb A1c: Recent Labs    07/21/20 0503  HGBA1C 7.0*   Lipid Profile: No results for input(s): CHOL, HDL, LDLCALC, TRIG, CHOLHDL, LDLDIRECT in the last 72 hours. Thyroid function studies: Recent Labs    07/21/20 0503  TSH 0.541   Anemia work up: No results for input(s): VITAMINB12, FOLATE, FERRITIN, TIBC, IRON, RETICCTPCT in the last 72 hours. Sepsis Labs: Recent Labs  Lab 07/20/20 1349 07/21/20 0503  WBC 6.4 5.1    Microbiology Recent Results (from the past 240 hour(s))  Respiratory Panel by RT PCR (Flu A&B, Covid) - Nasopharyngeal Swab     Status: None   Collection Time: 07/20/20  7:05 PM   Specimen: Nasopharyngeal Swab  Result Value Ref Range Status   SARS Coronavirus 2 by RT PCR NEGATIVE NEGATIVE Final    Comment: (NOTE) SARS-CoV-2 target nucleic acids are NOT DETECTED.  The SARS-CoV-2 RNA is generally detectable in upper respiratoy specimens during the acute phase of infection. The lowest concentration of SARS-CoV-2 viral copies this assay can detect is 131 copies/mL. A negative result does not preclude SARS-Cov-2 infection and should not be used as the sole basis for treatment or other patient management decisions. A negative result may occur with  improper specimen collection/handling, submission of specimen other than nasopharyngeal swab, presence of viral mutation(s) within the areas targeted by this assay, and inadequate number of viral copies (<131 copies/mL). A negative result must be combined with  clinical observations, patient history, and epidemiological information. The expected result is Negative.  Fact Sheet for Patients:  https://www.moore.com/  Fact Sheet for Healthcare Providers:  https://www.young.biz/  This test is no t yet approved or cleared by the [place] FDA and  has been authorized for detection and/or diagnosis of SARS-CoV-2 by FDA under an  Emergency Use Authorization (EUA). This EUA will remain  in effect (meaning this test can be used) for the duration of the COVID-19 declaration under Section 564(b)(1) of the Act, 21 U.S.C. section 360bbb-3(b)(1), unless the authorization is terminated or revoked sooner.     Influenza A by PCR NEGATIVE NEGATIVE Final   Influenza B by PCR NEGATIVE NEGATIVE Final    Comment: (NOTE) The Xpert Xpress SARS-CoV-2/FLU/RSV assay is intended as an aid in  the diagnosis of influenza from Nasopharyngeal swab specimens and  should not be used as a sole basis for treatment. Nasal washings and  aspirates are unacceptable for Xpert Xpress SARS-CoV-2/FLU/RSV  testing.  Fact Sheet for Patients: https://www.moore.com/  Fact Sheet for Healthcare Providers: https://www.young.biz/  This test is not yet approved or cleared by the [place] FDA and  has been authorized for detection and/or diagnosis of SARS-CoV-2 by  FDA under an Emergency Use Authorization (EUA). This EUA will remain  in effect (meaning this test can be used) for the duration of the  Covid-19 declaration under Section 564(b)(1) of the Act, 21  U.S.C. section 360bbb-3(b)(1), unless the authorization is  terminated or revoked. Performed at [facility], [address] [place], [place] [address]     Procedures and diagnostic studies:  DG Chest 2 View  Result Date: 07/20/2020 CLINICAL DATA:  Shortness of breath. EXAM: CHEST - 2 VIEW COMPARISON:  March 23, 2019 FINDINGS:  The heart size and mediastinal contours are within normal limits. Aortic atherosclerosis. No consolidation. No visible pleural effusions or pneumothorax. The visualized skeletal structures are unremarkable. IMPRESSION: No active cardiopulmonary disease. Electronically Signed   By: Feliberto Harts MD   On: 07/20/2020 14:30   ECHOCARDIOGRAM COMPLETE  Result Date: 07/22/2020    ECHOCARDIOGRAM REPORT   Patient Name:   Gabrielle Evans [facility] Date of Exam: 07/21/2020 Medical Rec #:  [id number]      Height:       60.0 in Accession #:    [id number]     Weight:       202.7 lb Date of Birth:  [birth year]     BSA:          1.877 m Patient Age:    79 years       BP:           128/78 mmHg Patient Gender: F              HR:           45 bpm. Exam Location:  [facility] Procedure: 2D Echo, Cardiac Doppler and Color Doppler Indications:     Abnormal ECG 794.31  History:         Patient has no prior history of Echocardiogram examinations.                  Risk Factors:Diabetes and Hypertension. Mild Aortic stenosis.  Sonographer:     Cristela Blue RDCS (AE) Referring Phys:  [id number] Angie Fava Diagnosing Phys: Alwyn Pea MD IMPRESSIONS  1. Left ventricular ejection fraction, by estimation, is 65 to 70%. The left ventricle has normal function. The left ventricle has no regional wall motion abnormalities. Left ventricular diastolic parameters were normal.  2. Right ventricular systolic function is normal. The right ventricular size is normal.  3. The mitral valve is grossly normal. No evidence of mitral valve regurgitation.  4. The aortic valve is grossly normal. Aortic valve regurgitation is not visualized. FINDINGS  Left Ventricle: Left ventricular ejection fraction,  by estimation, is 65 to 70%. The left ventricle has normal function. The left ventricle has no regional wall motion abnormalities. The left ventricular internal cavity size was normal in size. There is  borderline left ventricular hypertrophy. Left ventricular  diastolic parameters were normal. Right Ventricle: The right ventricular size is normal. No increase in right ventricular wall thickness. Right ventricular systolic function is normal. Left Atrium: Left atrial size was normal in size. Right Atrium: Right atrial size was normal in size. Pericardium: There is no evidence of pericardial effusion. Mitral Valve: The mitral valve is grossly normal. There is moderate thickening of the mitral valve leaflet(s). Mild to moderate mitral annular calcification. No evidence of mitral valve regurgitation. MV peak gradient, 12.2 mmHg. The mean mitral valve gradient is 3.0 mmHg. Tricuspid Valve: The tricuspid valve is normal in structure. Tricuspid valve regurgitation is trivial. Aortic Valve: The aortic valve is grossly normal. Aortic valve regurgitation is not visualized. Aortic valve mean gradient measures 7.5 mmHg. Aortic valve peak gradient measures 15.3 mmHg. Aortic valve area, by VTI measures 1.82 cm. Pulmonic Valve: The pulmonic valve was grossly normal. Pulmonic valve regurgitation is not visualized. Aorta: The aortic arch was not well visualized. IAS/Shunts: No atrial level shunt detected by color flow Doppler.  LEFT VENTRICLE PLAX 2D LVIDd:         5.06 cm  Diastology LVIDs:         3.11 cm  LV e' medial:    12.40 cm/s LV PW:         1.68 cm  LV E/e' medial:  13.5 LV IVS:        1.10 cm  LV e' lateral:   13.40 cm/s LVOT diam:     2.00 cm  LV E/e' lateral: 12.5 LV SV:         82 LV SV Index:   44 LVOT Area:     3.14 cm  RIGHT VENTRICLE RV Basal diam:  4.12 cm RV S prime:     16.50 cm/s TAPSE (M-mode): 2.4 cm LEFT ATRIUM             Index       RIGHT ATRIUM           Index LA diam:        4.90 cm 2.61 cm/m  RA Area:     10.50 cm LA Vol (A2C):   82.7 ml 44.05 ml/m RA Volume:   23.40 ml  12.46 ml/m LA Vol (A4C):   66.7 ml 35.53 ml/m LA Biplane Vol: 76.2 ml 40.59 ml/m  AORTIC VALVE                    PULMONIC VALVE AV Area (Vmax):    1.64 cm     PV Vmax:        1.39  m/s AV Area (Vmean):   1.56 cm     PV Peak grad:   7.7 mmHg AV Area (VTI):     1.82 cm     RVOT Peak grad: 12 mmHg AV Vmax:           195.50 cm/s AV Vmean:          127.500 cm/s AV VTI:            0.453 m AV Peak Grad:      15.3 mmHg AV Mean Grad:      7.5 mmHg LVOT Vmax:         102.00 cm/s LVOT  Vmean:        63.300 cm/s LVOT VTI:          0.262 m LVOT/AV VTI ratio: 0.58  AORTA Ao Root diam: 3.20 cm MITRAL VALVE                TRICUSPID VALVE MV Area (PHT): 4.17 cm     TR Peak grad:   29.8 mmHg MV Peak grad:  12.2 mmHg    TR Vmax:        273.00 cm/s MV Mean grad:  3.0 mmHg MV Vmax:       1.75 m/s     SHUNTS MV Vmean:      68.9 cm/s    Systemic VTI:  0.26 m MV Decel Time: 182 msec     Systemic Diam: 2.00 cm MV E velocity: 168.00 cm/s MV A velocity: 101.00 cm/s MV E/A ratio:  1.66 Dwayne D Callwood MD Electronically signed by Alwyn Peawayne D Callwood MD Signature Date/Time: 07/22/2020/11:11:48 AM    Final                LOS: 2 days   Momin Misko  [facility]   Pager on [email or web address]. If 7PM-7AM, please contact night-coverage at [email or web address]     07/22/2020, 11:22 AM

## 2020-07-22 NOTE — Progress Notes (Signed)
[facility] Cardiology    SUBJECTIVE: Patient states to be doing reasonably well only complains of some insomnia that improved with Ativan no syncope no lightheadedness no chest pain.  Patient prepared for permanent pacemaker   Vitals:   07/21/20 1633 07/21/20 1948 07/22/20 0421 07/22/20 0800  BP: 132/66 128/62 118/72 (!) 157/78  Pulse:  (!) 44 67 76  Resp: 17 14 16 18   Temp: 97.6 F (36.4 C) (!) 97.4 F (36.3 C) 98.1 F (36.7 C) (!) 97.5 F (36.4 C)  TempSrc: Oral Oral Oral Oral  SpO2: 97% 96% 99%   Weight:   91 kg   Height:         Intake/Output Summary (Last 24 hours) at 07/22/2020 1023 Last data filed at 07/21/2020 1948 Gross per 24 hour  Intake 600 ml  Output 800 ml  Net -200 ml      PHYSICAL EXAM  General: Well developed, well nourished, in no acute distress HEENT:  Normocephalic and atramatic Neck:  No JVD.  Lungs: Clear bilaterally to auscultation and percussion. Heart: HRRR . Normal S1 and S2 without gallops or murmurs.  Abdomen: Bowel sounds are positive, abdomen soft and non-tender  Msk:  Back normal, normal gait. Normal strength and tone for age. Extremities: No clubbing, cyanosis or edema.   Neuro: Alert and oriented X 3. Psych:  Good affect, responds appropriately   LABS: Basic Metabolic Panel: Recent Labs    07/20/20 1349 07/21/20 0503  NA 136 139  K 4.7 4.3  CL 99 103  CO2 27 27  GLUCOSE 173* 164*  BUN 24* 22  CREATININE 1.10* 0.89  CALCIUM 9.5 9.2  MG  --  1.8   Liver Function Tests: Recent Labs    07/21/20 0503  AST 30  ALT 34  ALKPHOS 74  BILITOT 0.8  PROT 6.2*  ALBUMIN 3.5   No results for input(s): LIPASE, AMYLASE in the last 72 hours. CBC: Recent Labs    07/20/20 1349 07/21/20 0503  WBC 6.4 5.1  HGB 12.7 11.8*  HCT 38.8 35.4*  MCV 90.2 90.3  PLT 212 165   Cardiac Enzymes: No results for input(s): CKTOTAL, CKMB, CKMBINDEX, TROPONINI in the last 72 hours. BNP: Invalid input(s): POCBNP D-Dimer: No results for  input(s): DDIMER in the last 72 hours. Hemoglobin A1C: Recent Labs    07/21/20 0503  HGBA1C 7.0*   Fasting Lipid Panel: No results for input(s): CHOL, HDL, LDLCALC, TRIG, CHOLHDL, LDLDIRECT in the last 72 hours. Thyroid Function Tests: Recent Labs    07/21/20 0503  TSH 0.541   Anemia Panel: No results for input(s): VITAMINB12, FOLATE, FERRITIN, TIBC, IRON, RETICCTPCT in the last 72 hours.  DG Chest 2 View  Result Date: 07/20/2020 CLINICAL DATA:  Shortness of breath. EXAM: CHEST - 2 VIEW COMPARISON:  March 23, 2019 FINDINGS: The heart size and mediastinal contours are within normal limits. Aortic atherosclerosis. No consolidation. No visible pleural effusions or pneumothorax. The visualized skeletal structures are unremarkable. IMPRESSION: No active cardiopulmonary disease. Electronically Signed   By: March 25, 2019 MD   On: 07/20/2020 14:30     Echo pending  TELEMETRY: Normal sinus rhythm 65:  ASSESSMENT AND PLAN:  Principal Problem:   Bradycardia Active Problems:   Anemia   Hypertension   Type 2 diabetes mellitus (HCC)   GERD (gastroesophageal reflux disease)    Plan  Continue telemetry for high-grade AV block Patient currently asymptomatic heart rate in the 60s Continue blood pressure management and control Agree with diabetes  management Continue reflux therapy Recommend Ativan at bedtime to help with insomnia Plan for permanent pacemaker on Monday as an inpatient   Alwyn Pea, MD 07/22/2020 10:23 AM

## 2020-07-23 LAB — CALCIUM, IONIZED: Calcium, Ionized, Serum: 5.1 mg/dL (ref 4.5–5.6)

## 2020-07-23 LAB — GLUCOSE, CAPILLARY
Glucose-Capillary: 207 mg/dL — ABNORMAL HIGH (ref 70–99)
Glucose-Capillary: 306 mg/dL — ABNORMAL HIGH (ref 70–99)
Glucose-Capillary: 212 mg/dL — ABNORMAL HIGH (ref 70–99)
Glucose-Capillary: 74 mg/dL (ref 70–99)

## 2020-07-23 MED ORDER — INSULIN DETEMIR 100 UNIT/ML ~~LOC~~ SOLN
15.0000 [IU] | Freq: Every day | SUBCUTANEOUS | Status: DC
  Administered 2020-07-23: 15 [IU] via SUBCUTANEOUS
  Filled 2020-07-23: qty 0.15

## 2020-07-23 NOTE — Progress Notes (Signed)
Progress Note    [name]  MEQ:[id number] DOB: Jul 28, 1941  DOA: 07/20/2020 PCP: Patrice Paradise, MD      Brief Narrative:    Medical records reviewed and are as summarized below:  Gabrielle Evans is a 79 y.o. female       Assessment/Plan:   Principal Problem:   Bradycardia Active Problems:   Anemia   Hypertension   Type 2 diabetes mellitus (HCC)   GERD (gastroesophageal reflux disease)    Body mass index is 39.7 kg/m.  (Morbid obesity)  Sinus bradycardia/second-degree type II heart block: 2D echo showed EF estimated at 65 to 70% and normal LV diastolic parameters.  Plan for permanent pacemaker placement tomorrow.  Follow-up with cardiologist.  Cardiac monitor shows fluctuating heart rate ranging from the 30s to low 70s.  Type II DM with peripheral neuropathy: Give 1 dose of Levemir and hold after today because she will be n.p.o. after midnight for pacemaker tomorrow.  Humalog as needed for hyperglycemia.  Continue Lyrica for peripheral neuropathy  Hypertension: Continue HCTZ and amlodipine.    Diet Order            Diet regular Room service appropriate? Yes; Fluid consistency: Thin  Diet effective now                    Consultants:  Cardiologist  Procedures:  None    Medications:   . aspirin EC  81 mg Oral Daily  . enoxaparin (LOVENOX) injection  40 mg Subcutaneous Q24H  . felodipine  10 mg Oral Daily  . hydrochlorothiazide  25 mg Oral Daily  . influenza vaccine adjuvanted  0.5 mL Intramuscular Tomorrow-1000  . insulin aspart  0-9 Units Subcutaneous TID WC  . insulin detemir  15 Units Subcutaneous Daily  . pantoprazole  40 mg Oral Daily  . pregabalin  75 mg Oral BID   Continuous Infusions:   Anti-infectives (From admission, onward)   None             Family Communication/Anticipated D/C date and plan/Code Status   DVT prophylaxis: enoxaparin (LOVENOX) injection 40 mg Start: 07/21/20 0900     Code Status:  Full Code  Family Communication: None Disposition Plan:    Status is: Inpatient  Remains inpatient appropriate because:Inpatient level of care appropriate due to severity of illness   Dispo: The patient is from: Home              Anticipated d/c is to: Home              Anticipated d/c date is: 2 days              Patient currently is not medically stable to d/c.           Subjective:   Interval events noted.  She feels okay at rest.  However, she lightheaded or short of breath with exertion.  Objective:    Vitals:   07/22/20 2023 07/23/20 0425 07/23/20 0700 07/23/20 0711  BP: (!) 141/56 133/61 (!) 167/46 132/78  Pulse: 76 61 75   Resp: 12 12 16    Temp: 98.4 F (36.9 C) 98.1 F (36.7 C) 98 F (36.7 C)   TempSrc: Oral Oral Oral   SpO2: 95% 93% 94%   Weight:  92.2 kg    Height:       No data found.   Intake/Output Summary (Last 24 hours) at 07/23/2020 1101 Last data filed at 07/23/2020  1034 Gross per 24 hour  Intake 360 ml  Output --  Net 360 ml   Filed Weights   07/21/20 0141 07/22/20 0421 07/23/20 0425  Weight: 91.9 kg 91 kg 92.2 kg    Exam:   GEN: NAD SKIN: No rash EYES: EOMI ENT: MMM CV: RRR PULM: CTA B ABD: soft, obese, NT, +BS CNS: AAO x 3, non focal EXT: No edema.  Chronic tenderness in bilateral legs from neuropathy.      Data Reviewed:   I have personally reviewed following labs and imaging studies:  Labs: Labs show the following:   Basic Metabolic Panel: Recent Labs  Lab 07/20/20 1349 07/21/20 0503  NA 136 139  K 4.7 4.3  CL 99 103  CO2 27 27  GLUCOSE 173* 164*  BUN 24* 22  CREATININE 1.10* 0.89  CALCIUM 9.5 9.2  MG  --  1.8   GFR Estimated Creatinine Clearance: 51.9 mL/min (by C-G formula based on SCr of 0.89 mg/dL). Liver Function Tests: Recent Labs  Lab 07/21/20 0503  AST 30  ALT 34  ALKPHOS 74  BILITOT 0.8  PROT 6.2*  ALBUMIN 3.5   No results for input(s): LIPASE, AMYLASE in the last 168  hours. No results for input(s): AMMONIA in the last 168 hours. Coagulation profile No results for input(s): INR, PROTIME in the last 168 hours.  CBC: Recent Labs  Lab 07/20/20 1349 07/21/20 0503  WBC 6.4 5.1  HGB 12.7 11.8*  HCT 38.8 35.4*  MCV 90.2 90.3  PLT 212 165   Cardiac Enzymes: No results for input(s): CKTOTAL, CKMB, CKMBINDEX, TROPONINI in the last 168 hours. BNP (last 3 results) No results for input(s): PROBNP in the last 8760 hours. CBG: Recent Labs  Lab 07/22/20 0807 07/22/20 1209 07/22/20 1648 07/22/20 2021 07/23/20 0733  GLUCAP 206* 211* 168* 255* 207*   D-Dimer: No results for input(s): DDIMER in the last 72 hours. Hgb A1c: Recent Labs    07/21/20 0503  HGBA1C 7.0*   Lipid Profile: No results for input(s): CHOL, HDL, LDLCALC, TRIG, CHOLHDL, LDLDIRECT in the last 72 hours. Thyroid function studies: Recent Labs    07/21/20 0503  TSH 0.541   Anemia work up: No results for input(s): VITAMINB12, FOLATE, FERRITIN, TIBC, IRON, RETICCTPCT in the last 72 hours. Sepsis Labs: Recent Labs  Lab 07/20/20 1349 07/21/20 0503  WBC 6.4 5.1    Microbiology Recent Results (from the past 240 hour(s))  Respiratory Panel by RT PCR (Flu A&B, Covid) - Nasopharyngeal Swab     Status: None   Collection Time: 07/20/20  7:05 PM   Specimen: Nasopharyngeal Swab  Result Value Ref Range Status   SARS Coronavirus 2 by RT PCR NEGATIVE NEGATIVE Final    Comment: (NOTE) SARS-CoV-2 target nucleic acids are NOT DETECTED.  The SARS-CoV-2 RNA is generally detectable in upper respiratoy specimens during the acute phase of infection. The lowest concentration of SARS-CoV-2 viral copies this assay can detect is 131 copies/mL. A negative result does not preclude SARS-Cov-2 infection and should not be used as the sole basis for treatment or other patient management decisions. A negative result may occur with  improper specimen collection/handling, submission of specimen  other than nasopharyngeal swab, presence of viral mutation(s) within the areas targeted by this assay, and inadequate number of viral copies (<131 copies/mL). A negative result must be combined with clinical observations, patient history, and epidemiological information. The expected result is Negative.  Fact Sheet for Patients:  https://www.moore.com/https://www.fda.gov/media/142436/download  Fact Sheet  for Healthcare Providers:  https://www.young.biz/  This test is no t yet approved or cleared by the [place] and  has been authorized for detection and/or diagnosis of SARS-CoV-2 by FDA under an Emergency Use Authorization (EUA). This EUA will remain  in effect (meaning this test can be used) for the duration of the COVID-19 declaration under Section 564(b)(1) of the Act, 21 U.S.C. section 360bbb-3(b)(1), unless the authorization is terminated or revoked sooner.     Influenza A by PCR NEGATIVE NEGATIVE Final   Influenza B by PCR NEGATIVE NEGATIVE Final    Comment: (NOTE) The Xpert Xpress SARS-CoV-2/FLU/RSV assay is intended as an aid in  the diagnosis of influenza from Nasopharyngeal swab specimens and  should not be used as a sole basis for treatment. Nasal washings and  aspirates are unacceptable for Xpert Xpress SARS-CoV-2/FLU/RSV  testing.  Fact Sheet for Patients: https://www.moore.com/  Fact Sheet for Healthcare Providers: https://www.young.biz/  This test is not yet approved or cleared by the [place] FDA and  has been authorized for detection and/or diagnosis of SARS-CoV-2 by  FDA under an Emergency Use Authorization (EUA). This EUA will remain  in effect (meaning this test can be used) for the duration of the  Covid-19 declaration under Section 564(b)(1) of the Act, 21  U.S.C. section 360bbb-3(b)(1), unless the authorization is  terminated or revoked. Performed at [facility], [address]  [place], [place] [address]     Procedures and diagnostic studies:  ECHOCARDIOGRAM COMPLETE  Result Date: 07/22/2020    ECHOCARDIOGRAM REPORT   Patient Name:   ZAREAH HUNZEKER [facility] Date of Exam: 07/21/2020 Medical Rec #:  [id number]      Height:       60.0 in Accession #:    [id number]     Weight:       202.7 lb Date of Birth:  [birth year]     BSA:          1.877 m Patient Age:    79 years       BP:           128/78 mmHg Patient Gender: F              HR:           45 bpm. Exam Location:  [facility] Procedure: 2D Echo, Cardiac Doppler and Color Doppler Indications:     Abnormal ECG 794.31  History:         Patient has no prior history of Echocardiogram examinations.                  Risk Factors:Diabetes and Hypertension. Mild Aortic stenosis.  Sonographer:     Cristela Blue RDCS (AE) Referring Phys:  [id number] Angie Fava Diagnosing Phys: Alwyn Pea MD IMPRESSIONS  1. Left ventricular ejection fraction, by estimation, is 65 to 70%. The left ventricle has normal function. The left ventricle has no regional wall motion abnormalities. Left ventricular diastolic parameters were normal.  2. Right ventricular systolic function is normal. The right ventricular size is normal.  3. The mitral valve is grossly normal. No evidence of mitral valve regurgitation.  4. The aortic valve is grossly normal. Aortic valve regurgitation is not visualized. FINDINGS  Left Ventricle: Left ventricular ejection fraction, by estimation, is 65 to 70%. The left ventricle has normal function. The left ventricle has no regional wall motion abnormalities. The left ventricular internal cavity size was normal in size. There is  borderline left ventricular  hypertrophy. Left ventricular diastolic parameters were normal. Right Ventricle: The right ventricular size is normal. No increase in right ventricular wall thickness. Right ventricular systolic function is normal. Left Atrium: Left atrial size was normal in size. Right Atrium: Right atrial size was  normal in size. Pericardium: There is no evidence of pericardial effusion. Mitral Valve: The mitral valve is grossly normal. There is moderate thickening of the mitral valve leaflet(s). Mild to moderate mitral annular calcification. No evidence of mitral valve regurgitation. MV peak gradient, 12.2 mmHg. The mean mitral valve gradient is 3.0 mmHg. Tricuspid Valve: The tricuspid valve is normal in structure. Tricuspid valve regurgitation is trivial. Aortic Valve: The aortic valve is grossly normal. Aortic valve regurgitation is not visualized. Aortic valve mean gradient measures 7.5 mmHg. Aortic valve peak gradient measures 15.3 mmHg. Aortic valve area, by VTI measures 1.82 cm. Pulmonic Valve: The pulmonic valve was grossly normal. Pulmonic valve regurgitation is not visualized. Aorta: The aortic arch was not well visualized. IAS/Shunts: No atrial level shunt detected by color flow Doppler.  LEFT VENTRICLE PLAX 2D LVIDd:         5.06 cm  Diastology LVIDs:         3.11 cm  LV e' medial:    12.40 cm/s LV PW:         1.68 cm  LV E/e' medial:  13.5 LV IVS:        1.10 cm  LV e' lateral:   13.40 cm/s LVOT diam:     2.00 cm  LV E/e' lateral: 12.5 LV SV:         82 LV SV Index:   44 LVOT Area:     3.14 cm  RIGHT VENTRICLE RV Basal diam:  4.12 cm RV S prime:     16.50 cm/s TAPSE (M-mode): 2.4 cm LEFT ATRIUM             Index       RIGHT ATRIUM           Index LA diam:        4.90 cm 2.61 cm/m  RA Area:     10.50 cm LA Vol (A2C):   82.7 ml 44.05 ml/m RA Volume:   23.40 ml  12.46 ml/m LA Vol (A4C):   66.7 ml 35.53 ml/m LA Biplane Vol: 76.2 ml 40.59 ml/m  AORTIC VALVE                    PULMONIC VALVE AV Area (Vmax):    1.64 cm     PV Vmax:        1.39 m/s AV Area (Vmean):   1.56 cm     PV Peak grad:   7.7 mmHg AV Area (VTI):     1.82 cm     RVOT Peak grad: 12 mmHg AV Vmax:           195.50 cm/s AV Vmean:          127.500 cm/s AV VTI:            0.453 m AV Peak Grad:      15.3 mmHg AV Mean Grad:      7.5 mmHg LVOT  Vmax:         102.00 cm/s LVOT Vmean:        63.300 cm/s LVOT VTI:          0.262 m LVOT/AV VTI ratio: 0.58  AORTA Ao Root diam: 3.20 cm MITRAL VALVE  TRICUSPID VALVE MV Area (PHT): 4.17 cm     TR Peak grad:   29.8 mmHg MV Peak grad:  12.2 mmHg    TR Vmax:        273.00 cm/s MV Mean grad:  3.0 mmHg MV Vmax:       1.75 m/s     SHUNTS MV Vmean:      68.9 cm/s    Systemic VTI:  0.26 m MV Decel Time: 182 msec     Systemic Diam: 2.00 cm MV E velocity: 168.00 cm/s MV A velocity: 101.00 cm/s MV E/A ratio:  1.66 Dwayne D Callwood MD Electronically signed by Alwyn Pea MD Signature Date/Time: 07/22/2020/11:11:48 AM    Final                LOS: 3 days   Devanshi Califf  [facility]   Pager on [email or web address]. If 7PM-7AM, please contact night-coverage at [email or web address]     07/23/2020, 11:01 AM

## 2020-07-23 NOTE — Progress Notes (Signed)
[facility]    SUBJECTIVE: Patient states to feel reasonably well no real symptoms no chest pain no blackout spells no syncope no palpitation tachycardia patient expressed complete understanding of the need for permanent pacemaker tomorrow and is agreed to undergo the procedure   Vitals:   07/22/20 2023 07/23/20 0425 07/23/20 0700 07/23/20 0711  BP: (!) 141/56 133/61 (!) 167/46 132/78  Pulse: 76 61 75   Resp: 12 12 16    Temp: 98.4 F (36.9 C) 98.1 F (36.7 C) 98 F (36.7 C)   TempSrc: Oral Oral Oral   SpO2: 95% 93% 94%   Weight:  92.2 kg    Height:         Intake/Output Summary (Last 24 hours) at 07/23/2020 1131 Last data filed at 07/23/2020 1034 Gross per 24 hour  Intake 360 ml  Output --  Net 360 ml      PHYSICAL EXAM  General: Well developed, well nourished, in no acute distress HEENT:  Normocephalic and atramatic Neck:  No JVD.  Lungs: Clear bilaterally to auscultation and percussion. Heart: Bradycardia. Normal S1 and S2 without gallops or murmurs.  Abdomen: Bowel sounds are positive, abdomen soft and non-tender  Msk:  Back normal, normal gait. Normal strength and tone for age. Extremities: No clubbing, cyanosis or edema.   Neuro: Alert and oriented X 3. Psych:  Good affect, responds appropriately   LABS: Basic Metabolic Panel: Recent Labs    07/20/20 1349 07/21/20 0503  NA 136 139  K 4.7 4.3  CL 99 103  CO2 27 27  GLUCOSE 173* 164*  BUN 24* 22  CREATININE 1.10* 0.89  CALCIUM 9.5 9.2  MG  --  1.8   Liver Function Tests: Recent Labs    07/21/20 0503  AST 30  ALT 34  ALKPHOS 74  BILITOT 0.8  PROT 6.2*  ALBUMIN 3.5   No results for input(s): LIPASE, AMYLASE in the last 72 hours. CBC: Recent Labs    07/20/20 1349 07/21/20 0503  WBC 6.4 5.1  HGB 12.7 11.8*  HCT 38.8 35.4*  MCV 90.2 90.3  PLT 212 165   Cardiac Enzymes: No results for input(s): CKTOTAL, CKMB, CKMBINDEX, TROPONINI in the last 72 hours. BNP: Invalid input(s):  POCBNP D-Dimer: No results for input(s): DDIMER in the last 72 hours. Hemoglobin A1C: Recent Labs    07/21/20 0503  HGBA1C 7.0*   Fasting Lipid Panel: No results for input(s): CHOL, HDL, LDLCALC, TRIG, CHOLHDL, LDLDIRECT in the last 72 hours. Thyroid Function Tests: Recent Labs    07/21/20 0503  TSH 0.541   Anemia Panel: No results for input(s): VITAMINB12, FOLATE, FERRITIN, TIBC, IRON, RETICCTPCT in the last 72 hours.  ECHOCARDIOGRAM COMPLETE  Result Date: 07/22/2020    ECHOCARDIOGRAM REPORT   Patient Name:   Gabrielle Evans [facility] Date of Exam: 07/21/2020 Medical Rec #:  [id number]      Height:       60.0 in Accession #:    [id number]     Weight:       202.7 lb Date of Birth:  Jul 05, 1941     BSA:          1.877 m Patient Age:    79 years       BP:           128/78 mmHg Patient Gender: F              HR:           45 bpm. Exam  Location:  [facility] Procedure: 2D Echo, Cardiac Doppler and Color Doppler Indications:     Abnormal ECG 794.31  History:         Patient has no prior history of Echocardiogram examinations.                  Risk Factors:Diabetes and Hypertension. Mild Aortic stenosis.  Sonographer:     Cristela Blue RDCS (AE) Referring Phys:  [id number] Angie Fava Diagnosing Phys: Alwyn Pea MD IMPRESSIONS  1. Left ventricular ejection fraction, by estimation, is 65 to 70%. The left ventricle has normal function. The left ventricle has no regional wall motion abnormalities. Left ventricular diastolic parameters were normal.  2. Right ventricular systolic function is normal. The right ventricular size is normal.  3. The mitral valve is grossly normal. No evidence of mitral valve regurgitation.  4. The aortic valve is grossly normal. Aortic valve regurgitation is not visualized. FINDINGS  Left Ventricle: Left ventricular ejection fraction, by estimation, is 65 to 70%. The left ventricle has normal function. The left ventricle has no regional wall motion abnormalities. The left ventricular  internal cavity size was normal in size. There is  borderline left ventricular hypertrophy. Left ventricular diastolic parameters were normal. Right Ventricle: The right ventricular size is normal. No increase in right ventricular wall thickness. Right ventricular systolic function is normal. Left Atrium: Left atrial size was normal in size. Right Atrium: Right atrial size was normal in size. Pericardium: There is no evidence of pericardial effusion. Mitral Valve: The mitral valve is grossly normal. There is moderate thickening of the mitral valve leaflet(s). Mild to moderate mitral annular calcification. No evidence of mitral valve regurgitation. MV peak gradient, 12.2 mmHg. The mean mitral valve gradient is 3.0 mmHg. Tricuspid Valve: The tricuspid valve is normal in structure. Tricuspid valve regurgitation is trivial. Aortic Valve: The aortic valve is grossly normal. Aortic valve regurgitation is not visualized. Aortic valve mean gradient measures 7.5 mmHg. Aortic valve peak gradient measures 15.3 mmHg. Aortic valve area, by VTI measures 1.82 cm. Pulmonic Valve: The pulmonic valve was grossly normal. Pulmonic valve regurgitation is not visualized. Aorta: The aortic arch was not well visualized. IAS/Shunts: No atrial level shunt detected by color flow Doppler.  LEFT VENTRICLE PLAX 2D LVIDd:         5.06 cm  Diastology LVIDs:         3.11 cm  LV e' medial:    12.40 cm/s LV PW:         1.68 cm  LV E/e' medial:  13.5 LV IVS:        1.10 cm  LV e' lateral:   13.40 cm/s LVOT diam:     2.00 cm  LV E/e' lateral: 12.5 LV SV:         82 LV SV Index:   44 LVOT Area:     3.14 cm  RIGHT VENTRICLE RV Basal diam:  4.12 cm RV S prime:     16.50 cm/s TAPSE (M-mode): 2.4 cm LEFT ATRIUM             Index       RIGHT ATRIUM           Index LA diam:        4.90 cm 2.61 cm/m  RA Area:     10.50 cm LA Vol (A2C):   82.7 ml 44.05 ml/m RA Volume:   23.40 ml  12.46 ml/m LA Vol (A4C):   66.7 ml  35.53 ml/m LA Biplane Vol: 76.2 ml 40.59  ml/m  AORTIC VALVE                    PULMONIC VALVE AV Area (Vmax):    1.64 cm     PV Vmax:        1.39 m/s AV Area (Vmean):   1.56 cm     PV Peak grad:   7.7 mmHg AV Area (VTI):     1.82 cm     RVOT Peak grad: 12 mmHg AV Vmax:           195.50 cm/s AV Vmean:          127.500 cm/s AV VTI:            0.453 m AV Peak Grad:      15.3 mmHg AV Mean Grad:      7.5 mmHg LVOT Vmax:         102.00 cm/s LVOT Vmean:        63.300 cm/s LVOT VTI:          0.262 m LVOT/AV VTI ratio: 0.58  AORTA Ao Root diam: 3.20 cm MITRAL VALVE                TRICUSPID VALVE MV Area (PHT): 4.17 cm     TR Peak grad:   29.8 mmHg MV Peak grad:  12.2 mmHg    TR Vmax:        273.00 cm/s MV Mean grad:  3.0 mmHg MV Vmax:       1.75 m/s     SHUNTS MV Vmean:      68.9 cm/s    Systemic VTI:  0.26 m MV Decel Time: 182 msec     Systemic Diam: 2.00 cm MV E velocity: 168.00 cm/s MV A velocity: 101.00 cm/s MV E/A ratio:  1.66 Maripat Borba D Millee Denise MD Electronically signed by Alwyn Pea MD Signature Date/Time: 07/22/2020/11:11:48 AM    Final      Echo preserved left ventricular function  TELEMETRY: Second-degree type II heart block rate of 40  ASSESSMENT AND PLAN:  Principal Problem:   Bradycardia Active Problems:   Anemia   Hypertension   Type 2 diabetes mellitus (HCC)   GERD (gastroesophageal reflux disease) 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 Darrold Junker Continue current therapy Maintain hypertension control Agree with diabetes management and control Maintain reflux type therapy   Alwyn Pea, MD 07/23/2020 11:31 AM

## 2020-07-24 ENCOUNTER — Encounter: Payer: Self-pay | Admitting: Internal Medicine

## 2020-07-24 ENCOUNTER — Encounter
Admission: EM | Disposition: A | Discharge status: Home / Self Care | Payer: Self-pay | Source: Home / Self Care | Attending: Internal Medicine

## 2020-07-24 HISTORY — PX: PACEMAKER LEADLESS INSERTION: EP1219

## 2020-07-24 LAB — POCT ACTIVATED CLOTTING TIME
Activated Clotting Time: 114 seconds
Activated Clotting Time: 114 seconds
Activated Clotting Time: 120 seconds
Activated Clotting Time: 120 seconds

## 2020-07-24 LAB — GLUCOSE, CAPILLARY
Glucose-Capillary: 203 mg/dL — ABNORMAL HIGH (ref 70–99)
Glucose-Capillary: 206 mg/dL — ABNORMAL HIGH (ref 70–99)
Glucose-Capillary: 207 mg/dL — ABNORMAL HIGH (ref 70–99)
Glucose-Capillary: 228 mg/dL — ABNORMAL HIGH (ref 70–99)
Glucose-Capillary: 237 mg/dL — ABNORMAL HIGH (ref 70–99)
Glucose-Capillary: 319 mg/dL — ABNORMAL HIGH (ref 70–99)

## 2020-07-24 SURGERY — PACEMAKER LEADLESS INSERTION
Anesthesia: Moderate Sedation

## 2020-07-24 MED ORDER — SODIUM CHLORIDE 0.45 % IV SOLN: INTRAVENOUS | Status: DC

## 2020-07-24 MED ORDER — DIPHENHYDRAMINE HCL 50 MG/ML IJ SOLN
INTRAMUSCULAR | Status: AC
  Filled 2020-07-24: qty 1

## 2020-07-24 MED ORDER — ACETAMINOPHEN 325 MG PO TABS: 650.0000 mg | ORAL_TABLET | ORAL | Status: DC | PRN

## 2020-07-24 MED ORDER — MIDAZOLAM HCL 2 MG/2ML IJ SOLN
INTRAMUSCULAR | Status: DC | PRN
  Administered 2020-07-24: 1 mg via INTRAVENOUS
  Administered 2020-07-24: 0.5 mg via INTRAVENOUS

## 2020-07-24 MED ORDER — SODIUM CHLORIDE 0.9% FLUSH
3.0000 mL | Freq: Two times a day (BID) | INTRAVENOUS | Status: DC
  Administered 2020-07-24 – 2020-07-25 (×2): 3 mL via INTRAVENOUS

## 2020-07-24 MED ORDER — HEPARIN SODIUM (PORCINE) 1000 UNIT/ML IJ SOLN
INTRAMUSCULAR | Status: AC
  Filled 2020-07-24: qty 1

## 2020-07-24 MED ORDER — ONDANSETRON HCL 4 MG/2ML IJ SOLN: 4.0000 mg | Freq: Four times a day (QID) | INTRAMUSCULAR | Status: DC | PRN

## 2020-07-24 MED ORDER — MIDAZOLAM HCL 2 MG/2ML IJ SOLN
INTRAMUSCULAR | Status: AC
  Filled 2020-07-24: qty 2

## 2020-07-24 MED ORDER — HEPARIN (PORCINE) IN NACL 1000-0.9 UT/500ML-% IV SOLN
INTRAVENOUS | Status: AC
  Filled 2020-07-24: qty 500

## 2020-07-24 MED ORDER — HEPARIN (PORCINE) IN NACL 1000-0.9 UT/500ML-% IV SOLN
INTRAVENOUS | Status: AC
  Filled 2020-07-24: qty 1000

## 2020-07-24 MED ORDER — HEPARIN SODIUM (PORCINE) 1000 UNIT/ML IJ SOLN
INTRAMUSCULAR | Status: DC | PRN
  Administered 2020-07-24 (×2): 5000 [IU] via INTRAVENOUS

## 2020-07-24 MED ORDER — HEPARIN (PORCINE) IN NACL 1000-0.9 UT/500ML-% IV SOLN
INTRAVENOUS | Status: DC | PRN
  Administered 2020-07-24: 1500 mL

## 2020-07-24 MED ORDER — FENTANYL CITRATE (PF) 100 MCG/2ML IJ SOLN
INTRAMUSCULAR | Status: AC
  Filled 2020-07-24: qty 2

## 2020-07-24 MED ORDER — SODIUM CHLORIDE 0.9% FLUSH: 3.0000 mL | INTRAVENOUS | Status: DC | PRN

## 2020-07-24 MED ORDER — SODIUM CHLORIDE 0.9 % IV SOLN: 250.0000 mL | INTRAVENOUS | Status: DC | PRN

## 2020-07-24 MED ORDER — LIDOCAINE HCL (PF) 1 % IJ SOLN
INTRAMUSCULAR | Status: AC
  Filled 2020-07-24: qty 30

## 2020-07-24 MED ORDER — IOHEXOL 300 MG/ML  SOLN
INTRAMUSCULAR | Status: DC | PRN
  Administered 2020-07-24: 45 mL

## 2020-07-24 MED ORDER — SODIUM CHLORIDE 0.9 % IV SOLN: INTRAVENOUS | Status: DC

## 2020-07-24 MED ORDER — INFLUENZA VAC A&B SA ADJ QUAD 0.5 ML IM PRSY
0.5000 mL | PREFILLED_SYRINGE | INTRAMUSCULAR | Status: AC
  Administered 2020-07-25: 0.5 mL via INTRAMUSCULAR
  Filled 2020-07-24: qty 0.5

## 2020-07-24 MED ORDER — FENTANYL CITRATE (PF) 100 MCG/2ML IJ SOLN
INTRAMUSCULAR | Status: DC | PRN
  Administered 2020-07-24: 25 ug via INTRAVENOUS
  Administered 2020-07-24: 50 ug via INTRAVENOUS

## 2020-07-24 MED ORDER — CEFAZOLIN SODIUM-DEXTROSE 2-4 GM/100ML-% IV SOLN
2.0000 g | INTRAVENOUS | Status: DC
  Filled 2020-07-24 (×2): qty 100

## 2020-07-24 SURGICAL SUPPLY — 21 items
CABLE ADAPT PACING TEMP 12FT (ADAPTER) ×3 IMPLANT
CANNULA 5F STIFF (CANNULA) IMPLANT
CATH INFINITI JR4 5F (CATHETERS) ×3 IMPLANT
DILATOR VESSEL 38 20CM 12FR (INTRODUCER) ×3 IMPLANT
DILATOR VESSEL 38 20CM 14FR (INTRODUCER) ×3 IMPLANT
DILATOR VESSEL 38 20CM 18FR (INTRODUCER) ×3 IMPLANT
DILATOR VESSEL 38 20CM 8FR (INTRODUCER) ×3 IMPLANT
GUIDEWIRE SUPER STIFF .035X180 (WIRE) ×3 IMPLANT
MICRA AV TRANSCATH PACING SYS (Pacemaker) ×3 IMPLANT
MICRA INTRODUCER SHEATH (SHEATH) ×3
NEEDLE PERC 18GX7CM (NEEDLE) ×3 IMPLANT
PACK CARDIAC CATH (CUSTOM PROCEDURE TRAY) ×3 IMPLANT
PAD ELECT DEFIB RADIOL ZOLL (MISCELLANEOUS) ×3 IMPLANT
PANNUS RETENTION SYSTEM 2 PAD (MISCELLANEOUS) ×3 IMPLANT
SHEATH AVANTI 6FR X 11CM (SHEATH) ×3 IMPLANT
SHEATH AVANTI 7FRX11 (SHEATH) ×3 IMPLANT
SHEATH INTRODUCER MICRA (SHEATH) ×1 IMPLANT
SLEEVE REPOSITIONING LENGTH 30 (MISCELLANEOUS) ×3 IMPLANT
SYSTEM PACING TRNSCTH AV MICRA (Pacemaker) ×1 IMPLANT
WIRE GUIDERIGHT .035X150 (WIRE) ×3 IMPLANT
WIRE PACING TEMP ST TIP 5 (CATHETERS) ×3 IMPLANT

## 2020-07-24 NOTE — Care Management Important Message (Signed)
Important Message  Patient Details  Name: Gabrielle Evans MRN: [id number] Date of Birth: Jan 02, 1941   Medicare Important Message Given:  Yes     Johnell Comings 07/24/2020, 12:23 PM

## 2020-07-24 NOTE — Plan of Care (Signed)
  Problem: Activity: Goal: Risk for activity intolerance will decrease Outcome: Progressing   Problem: Safety: Goal: Ability to remain free from injury will improve Outcome: Progressing   

## 2020-07-24 NOTE — Progress Notes (Signed)
Progress Note    Gabrielle Evans  TKW:[id number] DOB: Oct 19, 1940  DOA: 07/20/2020 PCP: Patrice Paradise, MD      Brief Narrative:    Medical records reviewed and are as summarized below:  Gabrielle Evans is a 79 y.o. female       Assessment/Plan:   Principal Problem:   Bradycardia Active Problems:   Anemia   Hypertension   Type 2 diabetes mellitus (HCC)   GERD (gastroesophageal reflux disease)    Body mass index is 40.56 kg/m.  (Morbid obesity)  Sinus bradycardia/second-degree type II heart block: 2D echo showed EF estimated at 65 to 70% and normal LV diastolic parameters.  Plan for permanent pacemaker placement today.  Follow-up with cardiologist for further recommendations.   Type II DM with peripheral neuropathy: Resume Levemir after pacemaker placement today.  NovoLog as needed for hyperglycemia.  Continue Lyrica for peripheral neuropathy.  Hypertension: Continue antihypertensives  Plan discussed with the patient and family at the bedside.  Diet Order            Diet NPO time specified Except for: Sips with Meds  Diet effective midnight           Diet regular Room service appropriate? Yes; Fluid consistency: Thin  Diet effective now                    Consultants:  Cardiologist  Procedures:  Plan for permanent pacemaker placement today    Medications:   . [MAR Hold] aspirin EC  81 mg Oral Daily  . [MAR Hold] enoxaparin (LOVENOX) injection  40 mg Subcutaneous Q24H  . [MAR Hold] felodipine  10 mg Oral Daily  . [MAR Hold] hydrochlorothiazide  25 mg Oral Daily  . influenza vaccine adjuvanted  0.5 mL Intramuscular Tomorrow-1000  . [MAR Hold] insulin aspart  0-9 Units Subcutaneous TID WC  . [MAR Hold] pantoprazole  40 mg Oral Daily  . [MAR Hold] pregabalin  75 mg Oral BID   Continuous Infusions: . sodium chloride    . sodium chloride 20 mL/hr at 07/24/20 0929  .  ceFAZolin (ANCEF) IV       Anti-infectives (From admission,  onward)   Start     Dose/Rate Route Frequency Ordered Stop   07/24/20 0315  ceFAZolin (ANCEF) IVPB 2g/100 mL premix        2 g 200 mL/hr over 30 Minutes Intravenous On call 07/24/20 0223 07/25/20 0315             Family Communication/Anticipated D/C date and plan/Code Status   DVT prophylaxis: enoxaparin (LOVENOX) injection 40 mg Start: 07/21/20 0900     Code Status: Full Code  Family Communication: None Disposition Plan:    Status is: Inpatient  Remains inpatient appropriate because:Inpatient level of care appropriate due to severity of illness   Dispo: The patient is from: Home              Anticipated d/c is to: Home              Anticipated d/c date is: 1 day              Patient currently is not medically stable to d/c.           Subjective:   No complaints.  No shortness of breath, chest pain, palpitations or dizziness.  Her family (daughter and father) is at the bedside.  Objective:    Vitals:   07/23/20 2036 07/24/20  0455 07/24/20 0812 07/24/20 1121  BP: 96/66 (!) 157/42 (!) 175/51 (!) 154/50  Pulse: 71 (!) 40 (!) 40 (!) 45  Resp: 17 17 18 20   Temp: 97.7 F (36.5 C) 98.4 F (36.9 C) 97.9 F (36.6 C) 98.6 F (37 C)  TempSrc: Oral  Oral Oral  SpO2: 95% 97% 98% 96%  Weight:  94.2 kg    Height:       No data found.   Intake/Output Summary (Last 24 hours) at 07/24/2020 1231 Last data filed at 07/23/2020 1925 Gross per 24 hour  Intake 480 ml  Output 250 ml  Net 230 ml   Filed Weights   07/22/20 0421 07/23/20 0425 07/24/20 0455  Weight: 91 kg 92.2 kg 94.2 kg    Exam:   GEN: NAD SKIN: No rash EYES: No pallor or icterus ENT: MMM CV: RRR, bradycardic PULM: CTA B ABD: soft, ND, NT, +BS CNS: AAO x 3, non focal EXT: No edema.  Mild chronic tenderness in bilateral legs from neuropathy.     Data Reviewed:   I have personally reviewed following labs and imaging studies:  Labs: Labs show the following:   Basic Metabolic  Panel: Recent Labs  Lab 07/20/20 1349 07/21/20 0503  NA 136 139  K 4.7 4.3  CL 99 103  CO2 27 27  GLUCOSE 173* 164*  BUN 24* 22  CREATININE 1.10* 0.89  CALCIUM 9.5 9.2  MG  --  1.8   GFR Estimated Creatinine Clearance: 52.6 mL/min (by C-G formula based on SCr of 0.89 mg/dL). Liver Function Tests: Recent Labs  Lab 07/21/20 0503  AST 30  ALT 34  ALKPHOS 74  BILITOT 0.8  PROT 6.2*  ALBUMIN 3.5   No results for input(s): LIPASE, AMYLASE in the last 168 hours. No results for input(s): AMMONIA in the last 168 hours. Coagulation profile No results for input(s): INR, PROTIME in the last 168 hours.  CBC: Recent Labs  Lab 07/20/20 1349 07/21/20 0503  WBC 6.4 5.1  HGB 12.7 11.8*  HCT 38.8 35.4*  MCV 90.2 90.3  PLT 212 165   Cardiac Enzymes: No results for input(s): CKTOTAL, CKMB, CKMBINDEX, TROPONINI in the last 168 hours. BNP (last 3 results) No results for input(s): PROBNP in the last 8760 hours. CBG: Recent Labs  Lab 07/23/20 1159 07/23/20 1620 07/23/20 2036 07/24/20 0806 07/24/20 1139  GLUCAP 306* 212* 74 228* 206*   D-Dimer: No results for input(s): DDIMER in the last 72 hours. Hgb A1c: No results for input(s): HGBA1C in the last 72 hours. Lipid Profile: No results for input(s): CHOL, HDL, LDLCALC, TRIG, CHOLHDL, LDLDIRECT in the last 72 hours. Thyroid function studies: No results for input(s): TSH, T4TOTAL, T3FREE, THYROIDAB in the last 72 hours.  Invalid input(s): FREET3 Anemia work up: No results for input(s): VITAMINB12, FOLATE, FERRITIN, TIBC, IRON, RETICCTPCT in the last 72 hours. Sepsis Labs: Recent Labs  Lab 07/20/20 1349 07/21/20 0503  WBC 6.4 5.1    Microbiology Recent Results (from the past 240 hour(s))  Respiratory Panel by RT PCR (Flu A&B, Covid) - Nasopharyngeal Swab     Status: None   Collection Time: 07/20/20  7:05 PM   Specimen: Nasopharyngeal Swab  Result Value Ref Range Status   SARS Coronavirus 2 by RT PCR NEGATIVE  NEGATIVE Final    Comment: (NOTE) SARS-CoV-2 target nucleic acids are NOT DETECTED.  The SARS-CoV-2 RNA is generally detectable in upper respiratoy specimens during the acute phase of infection. The lowest concentration  of SARS-CoV-2 viral copies this assay can detect is 131 copies/mL. A negative result does not preclude SARS-Cov-2 infection and should not be used as the sole basis for treatment or other patient management decisions. A negative result may occur with  improper specimen collection/handling, submission of specimen other than nasopharyngeal swab, presence of viral mutation(s) within the areas targeted by this assay, and inadequate number of viral copies (<131 copies/mL). A negative result must be combined with clinical observations, patient history, and epidemiological information. The expected result is Negative.  Fact Sheet for Patients:  https://www.moore.com/  Fact Sheet for Healthcare Providers:  https://www.young.biz/  This test is no t yet approved or cleared by the [place] FDA and  has been authorized for detection and/or diagnosis of SARS-CoV-2 by FDA under an Emergency Use Authorization (EUA). This EUA will remain  in effect (meaning this test can be used) for the duration of the COVID-19 declaration under Section 564(b)(1) of the Act, 21 U.S.C. section 360bbb-3(b)(1), unless the authorization is terminated or revoked sooner.     Influenza A by PCR NEGATIVE NEGATIVE Final   Influenza B by PCR NEGATIVE NEGATIVE Final    Comment: (NOTE) The Xpert Xpress SARS-CoV-2/FLU/RSV assay is intended as an aid in  the diagnosis of influenza from Nasopharyngeal swab specimens and  should not be used as a sole basis for treatment. Nasal washings and  aspirates are unacceptable for Xpert Xpress SARS-CoV-2/FLU/RSV  testing.  Fact Sheet for Patients: https://www.moore.com/  Fact Sheet for Healthcare  Providers: https://www.young.biz/  This test is not yet approved or cleared by the [place] FDA and  has been authorized for detection and/or diagnosis of SARS-CoV-2 by  FDA under an Emergency Use Authorization (EUA). This EUA will remain  in effect (meaning this test can be used) for the duration of the  Covid-19 declaration under Section 564(b)(1) of the Act, 21  U.S.C. section 360bbb-3(b)(1), unless the authorization is  terminated or revoked. Performed at [facility], [address] [place], [place] [address]     Procedures and diagnostic studies:  No results found.             LOS: 4 days   Lesieli Bresee  [facility]   Pager on [email or web address]. If 7PM-7AM, please contact night-coverage at [email or web address]     07/24/2020, 12:31 PM

## 2020-07-24 NOTE — Progress Notes (Signed)
Inpatient Diabetes Program Recommendations  AACE/ADA: New Consensus Statement on Inpatient Glycemic Control   Target Ranges:  Prepandial:   less than 140 mg/dL      Peak postprandial:   less than 180 mg/dL (1-2 hours)      Critically ill patients:  140 - 180 mg/dL   Results for Gabrielle Evans, Gabrielle Evans (MRN [id number]) as of 07/24/2020 09:31  Ref. Range 07/23/2020 07:33 07/23/2020 11:59 07/23/2020 16:20 07/23/2020 20:36 07/24/2020 08:06  Glucose-Capillary Latest Ref Range: 70 - 99 mg/dL 761 (H)  Novolog 3 units 306 (H)  Novolog 7 units  Levemir 15 units 212 (H)  Novolog 3 units 74 228 (H)  Novolog 3 units   Review of Glycemic Control  Diabetes history: DM2 Outpatient Diabetes medications: Levemir 20 units QAM, Levemir 80 units QHS, Metformin 1000 mg BID Current orders for Inpatient glycemic control: Novolog 0-9 units TID with meals  Inpatient Diabetes Program Recommendations:    Insulin: Please consider ordering Levemir 10 units daily.  NOTE: Noted patient received Levemir 15 units on 07/23/20 and glucose down to 74 mg/dl at 60:73 last night. Fasting glucose 228 mg/dl today. Recommend ordering Levemir 10 units daily.  Thanks, Orlando Penner, RN, MSN, CDE Diabetes Coordinator Inpatient Diabetes Program [phone] (Team Pager from 8am to 5pm)

## 2020-07-24 NOTE — Plan of Care (Signed)
  Problem: Education: Goal: Knowledge of General Education information will improve Description: Including pain rating scale, medication(s)/side effects and non-pharmacologic comfort measures 07/24/2020 0854 by Ansel Bong, RN Outcome: Progressing 07/24/2020 0854 by Ansel Bong, RN Outcome: Progressing   Problem: Health Behavior/Discharge Planning: Goal: Ability to manage health-related needs will improve 07/24/2020 0854 by Ansel Bong, RN Outcome: Progressing 07/24/2020 0854 by Ansel Bong, RN Outcome: Progressing   Problem: Clinical Measurements: Goal: Ability to maintain clinical measurements within normal limits will improve 07/24/2020 0854 by Ansel Bong, RN Outcome: Progressing 07/24/2020 0854 by Ansel Bong, RN Outcome: Progressing Goal: Will remain free from infection 07/24/2020 0854 by Ansel Bong, RN Outcome: Progressing 07/24/2020 0854 by Ansel Bong, RN Outcome: Progressing Goal: Diagnostic test results will improve 07/24/2020 0854 by Ansel Bong, RN Outcome: Progressing 07/24/2020 0854 by Ansel Bong, RN Outcome: Progressing Goal: Respiratory complications will improve 07/24/2020 0854 by Ansel Bong, RN Outcome: Progressing 07/24/2020 0854 by Ansel Bong, RN Outcome: Progressing Goal: Cardiovascular complication will be avoided 07/24/2020 0854 by Ansel Bong, RN Outcome: Progressing 07/24/2020 0854 by Ansel Bong, RN Outcome: Progressing   Problem: Activity: Goal: Risk for activity intolerance will decrease 07/24/2020 0854 by Ansel Bong, RN Outcome: Progressing 07/24/2020 0854 by Ansel Bong, RN Outcome: Progressing   Problem: Nutrition: Goal: Adequate nutrition will be maintained 07/24/2020 0854 by Ansel Bong, RN Outcome: Progressing 07/24/2020 0854 by Ansel Bong, RN Outcome: Progressing   Problem: Coping: Goal: Level of anxiety will decrease 07/24/2020 0854 by Ansel Bong, RN Outcome:  Progressing 07/24/2020 0854 by Ansel Bong, RN Outcome: Progressing   Problem: Elimination: Goal: Will not experience complications related to bowel motility 07/24/2020 0854 by Ansel Bong, RN Outcome: Progressing 07/24/2020 0854 by Ansel Bong, RN Outcome: Progressing Goal: Will not experience complications related to urinary retention 07/24/2020 0854 by Ansel Bong, RN Outcome: Progressing 07/24/2020 0854 by Ansel Bong, RN Outcome: Progressing   Problem: Pain Managment: Goal: General experience of comfort will improve 07/24/2020 0854 by Ansel Bong, RN Outcome: Progressing 07/24/2020 0854 by Ansel Bong, RN Outcome: Progressing   Problem: Safety: Goal: Ability to remain free from injury will improve 07/24/2020 0854 by Ansel Bong, RN Outcome: Progressing 07/24/2020 0854 by Ansel Bong, RN Outcome: Progressing   Problem: Skin Integrity: Goal: Risk for impaired skin integrity will decrease 07/24/2020 0854 by Ansel Bong, RN Outcome: Progressing 07/24/2020 0854 by Ansel Bong, RN Outcome: Progressing

## 2020-07-25 ENCOUNTER — Encounter: Payer: Self-pay | Admitting: Cardiology

## 2020-07-25 LAB — GLUCOSE, CAPILLARY
Glucose-Capillary: 196 mg/dL — ABNORMAL HIGH (ref 70–99)
Glucose-Capillary: 378 mg/dL — ABNORMAL HIGH (ref 70–99)

## 2020-07-25 LAB — BASIC METABOLIC PANEL
Anion gap: 8 (ref 5–15)
BUN: 31 mg/dL — ABNORMAL HIGH (ref 8–23)
CO2: 26 mmol/L (ref 22–32)
Calcium: 8.6 mg/dL — ABNORMAL LOW (ref 8.9–10.3)
Chloride: 100 mmol/L (ref 98–111)
Creatinine, Ser: 1.12 mg/dL — ABNORMAL HIGH (ref 0.44–1.00)
GFR, Estimated: 50 mL/min — ABNORMAL LOW (ref >60)
Glucose, Bld: 220 mg/dL — ABNORMAL HIGH (ref 70–99)
Potassium: 4.1 mmol/L (ref 3.5–5.1)
Sodium: 134 mmol/L — ABNORMAL LOW (ref 135–145)

## 2020-07-25 MED ORDER — INSULIN DETEMIR 100 UNIT/ML ~~LOC~~ SOLN
20.0000 [IU] | Freq: Two times a day (BID) | SUBCUTANEOUS | Status: DC
  Administered 2020-07-25: 20 [IU] via SUBCUTANEOUS
  Filled 2020-07-25 (×2): qty 0.2

## 2020-07-25 NOTE — Plan of Care (Signed)
  Problem: Clinical Measurements: Goal: Will remain free from infection Outcome: Progressing Goal: Cardiovascular complication will be avoided Outcome: Progressing   Problem: Clinical Measurements: Goal: Cardiovascular complication will be avoided Outcome: Progressing   Problem: Activity: Goal: Risk for activity intolerance will decrease Outcome: Progressing

## 2020-07-25 NOTE — Progress Notes (Signed)
Discharge instructions explained/pt verbalized understanding. IV and Tele removed. Will transport off unit via wheelchair.  

## 2020-07-25 NOTE — Progress Notes (Signed)
Inpatient Diabetes Program Recommendations  AACE/ADA: New Consensus Statement on Inpatient Glycemic Control  Target Ranges:  Prepandial:   less than 140 mg/dL      Peak postprandial:   less than 180 mg/dL (1-2 hours)      Critically ill patients:  140 - 180 mg/dL  Results for SHONTAY, WALLNER (MRN [id number]) as of 07/25/2020 07:08  Ref. Range 07/25/2020 04:22  Glucose Latest Ref Range: 70 - 99 mg/dL 080 (H)   Results for ALYSEN, SMYLIE (MRN [id number]) as of 07/25/2020 07:08  Ref. Range 07/24/2020 08:06 07/24/2020 11:39 07/24/2020 15:17 07/24/2020 16:46 07/24/2020 21:03 07/24/2020 23:54  Glucose-Capillary Latest Ref Range: 70 - 99 mg/dL 497 (H) 530 (H) 051 (H) 207 (H) 319 (H) 237 (H)    Review of Glycemic Control  Diabetes history: DM2 Outpatient Diabetes medications: Levemir 20 units QAM, Levemir 80 units QHS, Metformin 1000 mg BID Current orders for Inpatient glycemic control: Novolog 0-9 units TID with meals  Inpatient Diabetes Program Recommendations:    Insulin: Please consider ordering Levemir 10 units daily.  Thanks, Orlando Penner, RN, MSN, CDE Diabetes Coordinator Inpatient Diabetes Program [phone] (Team Pager from 8am to 5pm)

## 2020-07-25 NOTE — Progress Notes (Signed)
[facility] Cardiology  Patient Description: Gabrielle Evans is a 79 year old female with PMH significant for bradycardia, HTN, DM Type II, chronic iron deficiency anemia, GERD and obesity who was admitted for symptomatic bradycardia. The patient was found to have high-grade second and third-degree heart block while on telemetry thus Cardiology was consulted. On 07/24/2020 the patient underwent Micro AV leadless pacemaker insertion performed by Dr. Jamse Mead.   SUBJECTIVE: The patient reports to be doing well on today and is without any complaints. The patient denies all chest pain, dyspnea, dizziness, lightheadedness or generalized weakness at this time.   OBJECTIVE: The patient appears to be doing well on today. Telemetry reveals atrial sensing, ventricular pacing rhythm with a HR of 75 bpm. She is without any dyspnea and appears well. The right femoral site is clean, without any erythema or edema. The sutures were removed and the site has a very small amount of sanguineous drainage that was easily stopped with manual pressure. A small, dry gauze and transparent dressing was applied to the site. Right EJ site is clean, dry, without erythema or edema and the dressing remains in place.   Vitals:   07/24/20 1649 07/24/20 1952 07/25/20 0344 07/25/20 0446  BP: 91/75 131/71 (!) 155/63   Pulse: 79 80 66   Resp: 18 19 18    Temp: 97.7 F (36.5 C) (!) 97.4 F (36.3 C) 97.6 F (36.4 C)   TempSrc:  Oral Oral   SpO2: 100% 100% 93%   Weight:    90.2 kg  Height:         Intake/Output Summary (Last 24 hours) at 07/25/2020 0747 Last data filed at 07/24/2020 2156 Gross per 24 hour  Intake --  Output 500 ml  Net -500 ml      PHYSICAL EXAM  General: Well developed, well nourished, in no acute distress HEENT:  Normocephalic and atraumatic.  Neck:  No JVD. Tenderness at right EJ site.  Lungs: Clear bilaterally to auscultation. Chest expansion symmetrical. Heart: HRRR . Normal S1 and S2 without gallops or  murmurs.  Abdomen: Bowel sounds are positive, abdomen soft and non-tender  Msk:  Back normal, normal gait. Normal strength and tone for age. Extremities: mild, nonpitting BLE edema with tenderness. No clubbing or cyanosis.   Neuro: Alert and oriented X 3. Psych:  Good affect, responds appropriately    LABS: Basic Metabolic Panel: Recent Labs    07/25/20 0422  NA 134*  K 4.1  CL 100  CO2 26  GLUCOSE 220*  BUN 31*  CREATININE 1.12*  CALCIUM 8.6*   Liver Function Tests: No results for input(s): AST, ALT, ALKPHOS, BILITOT, PROT, ALBUMIN in the last 72 hours. No results for input(s): LIPASE, AMYLASE in the last 72 hours. CBC: No results for input(s): WBC, NEUTROABS, HGB, HCT, MCV, PLT in the last 72 hours. Cardiac Enzymes: No results for input(s): CKTOTAL, CKMB, CKMBINDEX, TROPONINI in the last 72 hours. BNP: Invalid input(s): POCBNP D-Dimer: No results for input(s): DDIMER in the last 72 hours. Hemoglobin A1C: No results for input(s): HGBA1C in the last 72 hours. Fasting Lipid Panel: No results for input(s): CHOL, HDL, LDLCALC, TRIG, CHOLHDL, LDLDIRECT in the last 72 hours. Thyroid Function Tests: No results for input(s): TSH, T4TOTAL, T3FREE, THYROIDAB in the last 72 hours.  Invalid input(s): FREET3 Anemia Panel: No results for input(s): VITAMINB12, FOLATE, FERRITIN, TIBC, IRON, RETICCTPCT in the last 72 hours.  EP PPM/ICD IMPLANT  Result Date: 07/24/2020 1.  Type II second-degree AV block 2.  Successful  Micra AV implantation    Echo: IMPRESSIONS  1. Left ventricular ejection fraction, by estimation, is 65 to 70%. The  left ventricle has normal function. The left ventricle has no regional  wall motion abnormalities. Left ventricular diastolic parameters were  normal.  2. Right ventricular systolic function is normal. The right ventricular  size is normal.  3. The mitral valve is grossly normal. No evidence of mitral valve  regurgitation.  4. The aortic  valve is grossly normal. Aortic valve regurgitation is not  visualized.   TELEMETRY: Atrial sensed, V-Paced rhythm with HR of 75 bpm.   ASSESSMENT AND PLAN:  Principal Problem:   Bradycardia Active Problems:   Anemia   Hypertension   Type 2 diabetes mellitus (HCC)   GERD (gastroesophageal reflux disease)    1. 2nd/3rd degree HB, stable, patient is now in atrial sensed, Ventricular Paced rhythm  -Continue to telemetry monitor until discharge.    -Have the patient follow-up in 1 week with [facility].   -Patient has been educated on site care (avoid swimming, avoid sitting in bathtub until site fully heals, etc.), bleeding precautions and pacemaker precautions. She verbalized understanding of all information.   -The patient is cleared for discharge from Cardiology standpoint.   2. HTN, reasonably controlled  -Continue management with Felodipine and HCTZ.  -Continue low sodium diet.  -Monitor VSS q4h per protocol.   3. DM Type II, stable  -Agree with sliding scale insulin management.   4. Anemia   5. GERD, stable  -Agree with current protonix therapy.    Gabrielle Evans, ACNPC-AG  07/25/2020 7:47 AM

## 2020-07-25 NOTE — Discharge Summary (Signed)
Physician Discharge Summary  Bartholomew CrewsBarbara K Klahn MWU:[id number] DOB: [birth year] DOA: 07/20/2020  PCP: Patrice ParadiseMcLaughlin, Miriam K, MD  Admit date: 07/20/2020 Discharge date: 07/25/2020  Discharge disposition: Home   Recommendations for Outpatient Follow-Up:   Follow-up with cardiologist in 1 week   Discharge Diagnosis:   Principal Problem:   Bradycardia Active Problems:   Anemia   Hypertension   Type 2 diabetes mellitus (HCC)   GERD (gastroesophageal reflux disease)    Discharge Condition: Stable.  Diet recommendation:  Diet Order            Diet - low sodium heart healthy           Diet Carb Modified           Diet Heart Room service appropriate? Yes; Fluid consistency: Thin  Diet effective now                   Code Status: Full Code     Hospital Course:   Ms. Jossie NgBarbara Livermore is a 79 year old woman with medical history significant for insulin-dependent type 2 DM, hypertension, iron deficiency anemia, GERD, peripheral neuropathy, who presented to the hospital with low heart rate, shortness of breath, presyncope and substernal burning sensation.  She said her heart rate was in the 30s and 40s when she checked it at home.  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  She was monitored closely on telemetry.  She was evaluated by the cardiologist.  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  She feels better and her heart rate has improved.  She is deemed stable for discharge to home.  Medical Consultants:    Cardiologist   Discharge Exam:    Vitals:   07/24/20 1952 07/25/20 0344 07/25/20 0446 07/25/20 0820  BP: 131/71 (!) 155/63  (!) 171/61  Pulse: 80 66  72  Resp: 19 18    Temp: (!) 97.4 F (36.3 C) 97.6 F (36.4 C)  97.9 F (36.6 C)  TempSrc: Oral Oral    SpO2: 100% 93%    Weight:   90.2 kg   Height:         GEN: NAD SKIN: Warm and dry EYES: EOMI ENT: MMM CV: RRR PULM: CTA B ABD: soft, obese, NT,  +BS CNS: AAO x 3, non focal EXT: Chronic mild tenderness of bilateral legs.  No edema or erythema   The results of significant diagnostics from this hospitalization (including imaging, microbiology, ancillary and laboratory) are listed below for reference.     Procedures and Diagnostic Studies:   ECHOCARDIOGRAM COMPLETE  Result Date: 07/22/2020    ECHOCARDIOGRAM REPORT   Patient Name:   [facility]BARA [facility] Date of Exam: 07/21/2020 Medical Rec #:  [id number]      Height:       60.0 in Accession #:    [id number]     Weight:       202.7 lb Date of Birth:  [birth year]     BSA:          1.877 m Patient Age:    79 years       BP:           128/78 mmHg Patient Gender: F              HR:           45 bpm. Exam Location:  [facility] Procedure: 2D Echo, Cardiac Doppler and Color Doppler Indications:     Abnormal  ECG 794.31  History:         Patient has no prior history of Echocardiogram examinations.                  Risk Factors:Diabetes and Hypertension. Mild Aortic stenosis.  Sonographer:     Cristela Blue RDCS (AE) Referring Phys:  [id number] Angie Fava Diagnosing Phys: Alwyn Pea MD IMPRESSIONS  1. Left ventricular ejection fraction, by estimation, is 65 to 70%. The left ventricle has normal function. The left ventricle has no regional wall motion abnormalities. Left ventricular diastolic parameters were normal.  2. Right ventricular systolic function is normal. The right ventricular size is normal.  3. The mitral valve is grossly normal. No evidence of mitral valve regurgitation.  4. The aortic valve is grossly normal. Aortic valve regurgitation is not visualized. FINDINGS  Left Ventricle: Left ventricular ejection fraction, by estimation, is 65 to 70%. The left ventricle has normal function. The left ventricle has no regional wall motion abnormalities. The left ventricular internal cavity size was normal in size. There is  borderline left ventricular hypertrophy. Left ventricular diastolic parameters were  normal. Right Ventricle: The right ventricular size is normal. No increase in right ventricular wall thickness. Right ventricular systolic function is normal. Left Atrium: Left atrial size was normal in size. Right Atrium: Right atrial size was normal in size. Pericardium: There is no evidence of pericardial effusion. Mitral Valve: The mitral valve is grossly normal. There is moderate thickening of the mitral valve leaflet(s). Mild to moderate mitral annular calcification. No evidence of mitral valve regurgitation. MV peak gradient, 12.2 mmHg. The mean mitral valve gradient is 3.0 mmHg. Tricuspid Valve: The tricuspid valve is normal in structure. Tricuspid valve regurgitation is trivial. Aortic Valve: The aortic valve is grossly normal. Aortic valve regurgitation is not visualized. Aortic valve mean gradient measures 7.5 mmHg. Aortic valve peak gradient measures 15.3 mmHg. Aortic valve area, by VTI measures 1.82 cm. Pulmonic Valve: The pulmonic valve was grossly normal. Pulmonic valve regurgitation is not visualized. Aorta: The aortic arch was not well visualized. IAS/Shunts: No atrial level shunt detected by color flow Doppler.  LEFT VENTRICLE PLAX 2D LVIDd:         5.06 cm  Diastology LVIDs:         3.11 cm  LV e' medial:    12.40 cm/s LV PW:         1.68 cm  LV E/e' medial:  13.5 LV IVS:        1.10 cm  LV e' lateral:   13.40 cm/s LVOT diam:     2.00 cm  LV E/e' lateral: 12.5 LV SV:         82 LV SV Index:   44 LVOT Area:     3.14 cm  RIGHT VENTRICLE RV Basal diam:  4.12 cm RV S prime:     16.50 cm/s TAPSE (M-mode): 2.4 cm LEFT ATRIUM             Index       RIGHT ATRIUM           Index LA diam:        4.90 cm 2.61 cm/m  RA Area:     10.50 cm LA Vol (A2C):   82.7 ml 44.05 ml/m RA Volume:   23.40 ml  12.46 ml/m LA Vol (A4C):   66.7 ml 35.53 ml/m LA Biplane Vol: 76.2 ml 40.59 ml/m  AORTIC VALVE  PULMONIC VALVE AV Area (Vmax):    1.64 cm     PV Vmax:        1.39 m/s AV Area (Vmean):   1.56  cm     PV Peak grad:   7.7 mmHg AV Area (VTI):     1.82 cm     RVOT Peak grad: 12 mmHg AV Vmax:           195.50 cm/s AV Vmean:          127.500 cm/s AV VTI:            0.453 m AV Peak Grad:      15.3 mmHg AV Mean Grad:      7.5 mmHg LVOT Vmax:         102.00 cm/s LVOT Vmean:        63.300 cm/s LVOT VTI:          0.262 m LVOT/AV VTI ratio: 0.58  AORTA Ao Root diam: 3.20 cm MITRAL VALVE                TRICUSPID VALVE MV Area (PHT): 4.17 cm     TR Peak grad:   29.8 mmHg MV Peak grad:  12.2 mmHg    TR Vmax:        273.00 cm/s MV Mean grad:  3.0 mmHg MV Vmax:       1.75 m/s     SHUNTS MV Vmean:      68.9 cm/s    Systemic VTI:  0.26 m MV Decel Time: 182 msec     Systemic Diam: 2.00 cm MV E velocity: 168.00 cm/s MV A velocity: 101.00 cm/s MV E/A ratio:  1.66 Dwayne D Callwood MD Electronically signed by Alwyn Pea MD Signature Date/Time: 07/22/2020/11:11:48 AM    Final      Labs:   Basic Metabolic Panel: Recent Labs  Lab 07/20/20 1349 07/20/20 1349 07/21/20 0503 07/25/20 0422  NA 136  --  139 134*  K 4.7   < > 4.3 4.1  CL 99  --  103 100  CO2 27  --  27 26  GLUCOSE 173*  --  164* 220*  BUN 24*  --  22 31*  CREATININE 1.10*  --  0.89 1.12*  CALCIUM 9.5  --  9.2 8.6*  MG  --   --  1.8  --    < > = values in this interval not displayed.   GFR Estimated Creatinine Clearance: 40.8 mL/min (A) (by C-G formula based on SCr of 1.12 mg/dL (H)). Liver Function Tests: Recent Labs  Lab 07/21/20 0503  AST 30  ALT 34  ALKPHOS 74  BILITOT 0.8  PROT 6.2*  ALBUMIN 3.5   No results for input(s): LIPASE, AMYLASE in the last 168 hours. No results for input(s): AMMONIA in the last 168 hours. Coagulation profile No results for input(s): INR, PROTIME in the last 168 hours.  CBC: Recent Labs  Lab 07/20/20 1349 07/21/20 0503  WBC 6.4 5.1  HGB 12.7 11.8*  HCT 38.8 35.4*  MCV 90.2 90.3  PLT 212 165   Cardiac Enzymes: No results for input(s): CKTOTAL, CKMB, CKMBINDEX, TROPONINI in the  last 168 hours. BNP: Invalid input(s): POCBNP CBG: Recent Labs  Lab 07/24/20 1517 07/24/20 1646 07/24/20 2103 07/24/20 2354 07/25/20 0755  GLUCAP 203* 207* 319* 237* 196*   D-Dimer No results for input(s): DDIMER in the last 72 hours. Hgb A1c No results for input(s): HGBA1C in the  last 72 hours. Lipid Profile No results for input(s): CHOL, HDL, LDLCALC, TRIG, CHOLHDL, LDLDIRECT in the last 72 hours. Thyroid function studies No results for input(s): TSH, T4TOTAL, T3FREE, THYROIDAB in the last 72 hours.  Invalid input(s): FREET3 Anemia work up No results for input(s): VITAMINB12, FOLATE, FERRITIN, TIBC, IRON, RETICCTPCT in the last 72 hours. Microbiology Recent Results (from the past 240 hour(s))  Respiratory Panel by RT PCR (Flu A&B, Covid) - Nasopharyngeal Swab     Status: None   Collection Time: 07/20/20  7:05 PM   Specimen: Nasopharyngeal Swab  Result Value Ref Range Status   SARS Coronavirus 2 by RT PCR NEGATIVE NEGATIVE Final    Comment: (NOTE) SARS-CoV-2 target nucleic acids are NOT DETECTED.  The SARS-CoV-2 RNA is generally detectable in upper respiratoy specimens during the acute phase of infection. The lowest concentration of SARS-CoV-2 viral copies this assay can detect is 131 copies/mL. A negative result does not preclude SARS-Cov-2 infection and should not be used as the sole basis for treatment or other patient management decisions. A negative result may occur with  improper specimen collection/handling, submission of specimen other than nasopharyngeal swab, presence of viral mutation(s) within the areas targeted by this assay, and inadequate number of viral copies (<131 copies/mL). A negative result must be combined with clinical observations, patient history, and epidemiological information. The expected result is Negative.  Fact Sheet for Patients:  https://www.moore.com/  Fact Sheet for Healthcare Providers:   https://www.young.biz/  This test is no t yet approved or cleared by the [place] FDA and  has been authorized for detection and/or diagnosis of SARS-CoV-2 by FDA under an Emergency Use Authorization (EUA). This EUA will remain  in effect (meaning this test can be used) for the duration of the COVID-19 declaration under Section 564(b)(1) of the Act, 21 U.S.C. section 360bbb-3(b)(1), unless the authorization is terminated or revoked sooner.     Influenza A by PCR NEGATIVE NEGATIVE Final   Influenza B by PCR NEGATIVE NEGATIVE Final    Comment: (NOTE) The Xpert Xpress SARS-CoV-2/FLU/RSV assay is intended as an aid in  the diagnosis of influenza from Nasopharyngeal swab specimens and  should not be used as a sole basis for treatment. Nasal washings and  aspirates are unacceptable for Xpert Xpress SARS-CoV-2/FLU/RSV  testing.  Fact Sheet for Patients: https://www.moore.com/  Fact Sheet for Healthcare Providers: https://www.young.biz/  This test is not yet approved or cleared by the [place] FDA and  has been authorized for detection and/or diagnosis of SARS-CoV-2 by  FDA under an Emergency Use Authorization (EUA). This EUA will remain  in effect (meaning this test can be used) for the duration of the  Covid-19 declaration under Section 564(b)(1) of the Act, 21  U.S.C. section 360bbb-3(b)(1), unless the authorization is  terminated or revoked. Performed at [facility], [address], [place], [place] [address]      Discharge Instructions:   Discharge Instructions    Diet - low sodium heart healthy   Complete by: As directed    Diet Carb Modified   Complete by: As directed    Increase activity slowly   Complete by: As directed      Allergies as of 07/25/2020      Reactions   Benazepril Other (See Comments)   "did not agree"   Benicar [olmesartan] Shortness Of Breath   Coreg [carvedilol]  Other (See Comments)   "did not agree"   Cozaar [losartan] Other (See Comments)   "did not  agree"   Hydralazine Other (See Comments)   "did not agree"   Lopressor [metoprolol Tartrate] Other (See Comments)   "did not agree"   Micardis [telmisartan] Other (See Comments)   "did not agree"   Norvasc [amlodipine] Other (See Comments)   "did not agree"   Tiazac [diltiazem] Other (See Comments)   "did not agree"   Toprol Xl [metoprolol] Other (See Comments)   "did not agree"      Medication List    TAKE these medications   aspirin EC 81 MG tablet Take 81 mg by mouth 2 (two) times daily.   felodipine 10 MG 24 hr tablet Commonly known as: PLENDIL Take 10 mg by mouth daily.   ferrous sulfate 325 (65 FE) MG tablet Take 325 mg by mouth daily.   furosemide 20 MG tablet Commonly known as: LASIX Take 20 mg by mouth daily as needed for edema.   glucose blood test strip 1 each by Other route 3 (three) times daily.   hydrochlorothiazide 25 MG tablet Commonly known as: HYDRODIURIL Take 25 mg by mouth daily.   latanoprost 0.005 % ophthalmic solution Commonly known as: XALATAN Place 1 drop into both eyes at bedtime.   Levemir 100 UNIT/ML injection Generic drug: insulin detemir Inject 20-80 Units into the skin 2 (two) times daily. Typically 20 units in the morning and 80 units at night   metFORMIN 1000 MG tablet Commonly known as: GLUCOPHAGE Take 1,000 mg by mouth 2 (two) times daily with a meal.   omeprazole 20 MG capsule Commonly known as: PRILOSEC Take 20 mg by mouth daily.   pregabalin 75 MG capsule Commonly known as: LYRICA Take 75 mg by mouth 2 (two) times daily. In mid-afternoon and at bedtime   senna-docusate 8.6-50 MG tablet Commonly known as: Senokot-S Take 2-3 tablets by mouth at bedtime.   sodium chloride 5 % ophthalmic solution Commonly known as: MURO 128 Place 1 drop into both eyes at bedtime.   timolol 0.5 % ophthalmic solution Commonly known as:  TIMOPTIC Place 1 drop into both eyes 2 (two) times daily.   Viactiv 500-500-40 MG-UNT-MCG Chew Generic drug: Calcium-Vitamin D-Vitamin K Chew 1-2 tablets by mouth daily.       Follow-up Information    Marcina Millard, MD Follow up on 08/02/2020.   Specialty: Cardiology Contact information: [address] [facility] [place] [address] [phone]                Time coordinating discharge: 31 minutes  Signed:  Lurene Shadow  [facility] 07/25/2020, 10:29 AM   Pager on [email or web address]. If 7PM-7AM, please contact night-coverage at [email or web address]

## 2020-09-27 ENCOUNTER — Other Ambulatory Visit: Payer: Self-pay | Admitting: Cardiology

## 2020-09-27 DIAGNOSIS — R002 Palpitations: Secondary | ICD-10-CM

## 2020-09-27 DIAGNOSIS — R0609 Other forms of dyspnea: Secondary | ICD-10-CM

## 2020-09-27 DIAGNOSIS — R0600 Dyspnea, unspecified: Secondary | ICD-10-CM

## 2020-09-27 DIAGNOSIS — R079 Chest pain, unspecified: Secondary | ICD-10-CM

## 2020-10-11 ENCOUNTER — Encounter
Admission: RE | Admit: 2020-10-11 | Discharge: 2020-10-11 | Disposition: A | Discharge status: Home / Self Care | Payer: Medicare PPO | Source: Ambulatory Visit | Attending: Cardiology | Admitting: Cardiology

## 2020-10-11 ENCOUNTER — Other Ambulatory Visit: Payer: Self-pay

## 2020-10-11 DIAGNOSIS — R079 Chest pain, unspecified: Secondary | ICD-10-CM | POA: Diagnosis present

## 2020-10-11 DIAGNOSIS — R0600 Dyspnea, unspecified: Secondary | ICD-10-CM | POA: Diagnosis present

## 2020-10-11 DIAGNOSIS — R0609 Other forms of dyspnea: Secondary | ICD-10-CM

## 2020-10-11 DIAGNOSIS — R002 Palpitations: Secondary | ICD-10-CM | POA: Insufficient documentation

## 2020-10-11 MED ORDER — TECHNETIUM TC 99M TETROFOSMIN IV KIT
10.0000 | PACK | Freq: Once | INTRAVENOUS | Status: AC | PRN
  Administered 2020-10-11: 10.39 via INTRAVENOUS

## 2020-10-11 MED ORDER — REGADENOSON 0.4 MG/5ML IV SOLN
0.4000 mg | Freq: Once | INTRAVENOUS | Status: AC
  Administered 2020-10-11: 0.4 mg via INTRAVENOUS

## 2020-10-11 MED ORDER — TECHNETIUM TC 99M TETROFOSMIN IV KIT
30.0000 | PACK | Freq: Once | INTRAVENOUS | Status: AC | PRN
  Administered 2020-10-11: 31.458 via INTRAVENOUS

## 2020-10-12 LAB — NM MYOCAR MULTI W/SPECT W/WALL MOTION / EF
Estimated workload: 1 METS
Exercise duration (min): 1 min
Exercise duration (sec): 2 s
LV dias vol: 118 mL (ref 46–106)
LV sys vol: 67 mL
MPHR: 141 {beats}/min
Peak HR: 90 {beats}/min
Percent HR: 63 %
Rest HR: 75 {beats}/min
SDS: 0
SRS: 21
SSS: 6
TID: 1.01

## 2021-04-12 ENCOUNTER — Other Ambulatory Visit: Payer: Self-pay

## 2021-04-12 ENCOUNTER — Other Ambulatory Visit: Payer: Self-pay | Admitting: Family Medicine

## 2021-04-12 ENCOUNTER — Other Ambulatory Visit (HOSPITAL_COMMUNITY): Payer: Self-pay | Admitting: Family Medicine

## 2021-04-12 ENCOUNTER — Ambulatory Visit
Admission: RE | Admit: 2021-04-12 | Discharge: 2021-04-12 | Disposition: A | Discharge status: Home / Self Care | Payer: Medicare PPO | Source: Ambulatory Visit | Attending: Family Medicine | Admitting: Family Medicine

## 2021-04-12 DIAGNOSIS — R112 Nausea with vomiting, unspecified: Secondary | ICD-10-CM

## 2021-04-12 DIAGNOSIS — R1031 Right lower quadrant pain: Secondary | ICD-10-CM

## 2021-04-17 ENCOUNTER — Ambulatory Visit (INDEPENDENT_AMBULATORY_CARE_PROVIDER_SITE_OTHER): Payer: Medicare PPO | Admitting: Urology

## 2021-04-17 ENCOUNTER — Encounter: Payer: Self-pay | Admitting: Urology

## 2021-04-17 ENCOUNTER — Other Ambulatory Visit: Payer: Self-pay

## 2021-04-17 VITALS — BP 166/83 | HR 74 | Ht 61.0 in | Wt 204.0 lb

## 2021-04-17 DIAGNOSIS — N135 Crossing vessel and stricture of ureter without hydronephrosis: Secondary | ICD-10-CM | POA: Diagnosis not present

## 2021-04-17 DIAGNOSIS — N2889 Other specified disorders of kidney and ureter: Secondary | ICD-10-CM

## 2021-04-17 NOTE — Progress Notes (Signed)
   04/17/2021 11:33 AM   Desmond Dike 1941/09/15 [id number]  Reason for visit: Follow up right renal mass, left UPJ obstruction, CKD  HPI: I saw Ms. Cabiness for the above issues.  Briefly she is a very comorbid 80 year old female with obesity, diabetes, pacemaker, CKD who I have previously followed for a chronic left UPJ obstruction as well as a right renal mass on active surveillance.  I last saw her in June 2020 when the mass measured ~2.5cm and recommended renal ultrasound in 9 months, but she did not follow-up.  She was recently seen for pain in the right pannus that has almost completely resolved and a CT without contrast was performed.  I personally viewed and interpreted this imaging, and this shows increase in size of the right renal mass to 4.6 cm consistent with RCC, as well as a low density involving the posterior portion of the lateral segment of left hepatic lobe.  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  Most recent renal function in March 2022 showed a creatinine 1.1, EGFR 48.  She cannot tolerate MRI secondary to severe anxiety/panic attacks.  We discussed options at length regarding her right-sided renal mass in the setting of CKD.  I discussed that this is completely unrelated to her right-sided pannus pain, and on exam she has no abnormalities of the right-sided pannus, and this is well away from the right flank.  We discussed options for renal masses including active surveillance, biopsy, ablation, and partial nephrectomy.  She would not be a good candidate for radical nephrectomy, as this would leave her dialysis dependent.  She is extremely averse to any procedures including biopsy or even ablation.  We discussed the risk of developing metastatic disease over the next 5 to 10 years which, though low, is certainly not 0, and her mass is increased ~2 cm over the last 2 years.  Despite extensive counseling, she  remains very resistant to any further interventions, and would like to continue surveillance.  I discussed the importance of close follow-up and monitoring.  We discussed return precautions including gross hematuria, bone pain, or weight loss.  RTC 6 months with renal ultrasound for active surveillance per patient request   [place], [place] [address] [place], [place] [address] [phone]

## 2021-04-19 ENCOUNTER — Ambulatory Visit: Payer: Medicare Other | Admitting: Urology

## 2021-04-30 ENCOUNTER — Other Ambulatory Visit: Payer: Self-pay | Admitting: Physician Assistant

## 2021-04-30 DIAGNOSIS — K7689 Other specified diseases of liver: Secondary | ICD-10-CM

## 2021-04-30 DIAGNOSIS — R1900 Intra-abdominal and pelvic swelling, mass and lump, unspecified site: Secondary | ICD-10-CM

## 2021-04-30 DIAGNOSIS — N2889 Other specified disorders of kidney and ureter: Secondary | ICD-10-CM

## 2021-06-19 ENCOUNTER — Other Ambulatory Visit: Payer: Self-pay | Admitting: Gastroenterology

## 2021-06-19 DIAGNOSIS — R7401 Elevation of levels of liver transaminase levels: Secondary | ICD-10-CM

## 2021-06-19 DIAGNOSIS — R932 Abnormal findings on diagnostic imaging of liver and biliary tract: Secondary | ICD-10-CM

## 2021-07-16 ENCOUNTER — Ambulatory Visit
Admission: RE | Admit: 2021-07-16 | Discharge: 2021-07-16 | Disposition: A | Discharge status: Home / Self Care | Payer: Medicare PPO | Source: Ambulatory Visit | Attending: Gastroenterology | Admitting: Gastroenterology

## 2021-07-16 ENCOUNTER — Ambulatory Visit: Payer: Medicare PPO

## 2021-07-16 ENCOUNTER — Other Ambulatory Visit: Payer: Self-pay

## 2021-07-16 DIAGNOSIS — R7401 Elevation of levels of liver transaminase levels: Secondary | ICD-10-CM | POA: Diagnosis present

## 2021-07-16 DIAGNOSIS — R932 Abnormal findings on diagnostic imaging of liver and biliary tract: Secondary | ICD-10-CM

## 2021-07-16 LAB — POCT I-STAT CREATININE: Creatinine, Ser: 1.1 mg/dL — ABNORMAL HIGH (ref 0.44–1.00)

## 2021-07-16 MED ORDER — IOHEXOL 300 MG/ML  SOLN
100.0000 mL | Freq: Once | INTRAMUSCULAR | Status: AC | PRN
  Administered 2021-07-16: 100 mL via INTRAVENOUS

## 2021-07-20 ENCOUNTER — Telehealth: Payer: Self-pay | Admitting: Urology

## 2021-07-20 NOTE — Telephone Encounter (Signed)
Sent referral to [facility] for a second opinion on her last CT scan per Dr.Sninsky she should have one. Patient voiced understanding.  Gabrielle Evans

## 2021-07-30 ENCOUNTER — Telehealth: Payer: Self-pay | Admitting: Urology

## 2021-07-30 NOTE — Telephone Encounter (Signed)
Patient lvm regarding her referral to [facility]. She wants to know who she was referred to and that it has been a week and hasn't heard from anyone at [facility]. Please advise.

## 2021-09-12 ENCOUNTER — Encounter: Payer: Self-pay | Admitting: Emergency Medicine

## 2021-09-12 ENCOUNTER — Emergency Department
Admission: EM | Admit: 2021-09-12 | Discharge: 2021-09-12 | Disposition: A | Discharge status: Home / Self Care | Payer: Medicare PPO | Attending: Emergency Medicine | Admitting: Emergency Medicine

## 2021-09-12 ENCOUNTER — Other Ambulatory Visit: Payer: Self-pay

## 2021-09-12 ENCOUNTER — Emergency Department: Payer: Medicare PPO

## 2021-09-12 DIAGNOSIS — I10 Essential (primary) hypertension: Secondary | ICD-10-CM | POA: Insufficient documentation

## 2021-09-12 DIAGNOSIS — Z7984 Long term (current) use of oral hypoglycemic drugs: Secondary | ICD-10-CM | POA: Insufficient documentation

## 2021-09-12 DIAGNOSIS — K219 Gastro-esophageal reflux disease without esophagitis: Secondary | ICD-10-CM | POA: Diagnosis not present

## 2021-09-12 DIAGNOSIS — E11319 Type 2 diabetes mellitus with unspecified diabetic retinopathy without macular edema: Secondary | ICD-10-CM | POA: Insufficient documentation

## 2021-09-12 DIAGNOSIS — Z79899 Other long term (current) drug therapy: Secondary | ICD-10-CM | POA: Diagnosis not present

## 2021-09-12 DIAGNOSIS — R0789 Other chest pain: Secondary | ICD-10-CM

## 2021-09-12 DIAGNOSIS — Z7982 Long term (current) use of aspirin: Secondary | ICD-10-CM | POA: Insufficient documentation

## 2021-09-12 DIAGNOSIS — Z87891 Personal history of nicotine dependence: Secondary | ICD-10-CM | POA: Diagnosis not present

## 2021-09-12 DIAGNOSIS — Y92410 Unspecified street and highway as the place of occurrence of the external cause: Secondary | ICD-10-CM | POA: Insufficient documentation
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
## 2021-09-12 NOTE — Discharge Instructions (Addendum)
-  Please return to the emergency department anytime if you begin to experience new or worsening symptoms. -Take Tylenol/ibuprofen as needed for pain.

## 2021-10-16 ENCOUNTER — Ambulatory Visit: Payer: Self-pay | Admitting: Urology

## 2022-04-23 DEATH — deceased
